# Patient Record
Sex: Female | Born: 1940
Health system: Southern US, Community
[De-identification: ages and names within clinical notes are randomized; demographics above are authoritative.]

## PROBLEM LIST (undated history)

## (undated) DIAGNOSIS — M816 Localized osteoporosis [Lequesne]: Secondary | ICD-10-CM

## (undated) DIAGNOSIS — T7840XA Allergy, unspecified, initial encounter: Secondary | ICD-10-CM

## (undated) DIAGNOSIS — H269 Unspecified cataract: Secondary | ICD-10-CM

## (undated) HISTORY — PX: BREAST CYST EXCISION: SHX579

## (undated) HISTORY — PX: UTERINE FIBROID SURGERY: SHX826

## (undated) HISTORY — DX: Unspecified cataract: H26.9

## (undated) HISTORY — DX: Allergy, unspecified, initial encounter: T78.40XA

## (undated) HISTORY — DX: Localized osteoporosis (Lequesne): M81.6

---

## 2007-08-22 HISTORY — PX: CATARACT EXTRACTION, BILATERAL: SHX1313

## 2007-08-22 HISTORY — PX: URETHRAL SLING: SHX2621

## 2007-09-23 ENCOUNTER — Ambulatory Visit (HOSPITAL_BASED_OUTPATIENT_CLINIC_OR_DEPARTMENT_OTHER): Admission: RE | Admit: 2007-09-23 | Discharge: 2007-09-23 | Payer: Self-pay | Admitting: Urology

## 2011-01-03 NOTE — Op Note (Signed)
[name], [name]                ACCOUNT NO.:  [id number]   MEDICAL RECORD NO.:  [id number]          PATIENT TYPE:  AMB   LOCATION:  [facility]                         FACILITY:  [facility]   PHYSICIAN:  Valetta Fuller, M.D.  DATE OF BIRTH:  11/11/1940   DATE OF PROCEDURE:  DATE OF DISCHARGE:                               OPERATIVE REPORT   PREOPERATIVE DIAGNOSIS:  Stress urinary incontinence.   POSTOPERATIVE DIAGNOSIS:  Stress urinary incontinence.   PROCEDURE PERFORMED:  Suburethral sling via retropubic approach [facility]  [employer] sling).   SURGEON:  Valetta Fuller, M.D.   ANESTHESIA:  General.   INDICATIONS:  [name] is a 70 year old female.  She had come in with  about a 5-year history of progressive urinary incontinence.  This  appeared to be mostly stress-based by history.  She also had some  leakage without awareness.  The patient was also noted to have  microhematuria.  Assessment included urodynamics which showed a slight  degree of minimal bladder instability.  She had a low Valsalva leak  point pressure of 60 cm water pressure at 275 mL capacity.  Leakage was  documented.  Her microhematuria workup was otherwise negative.  She  underwent extensive counseling about treatment options for her stress  incontinence.  She appeared to understand the advantages and  disadvantages of a suburethral sling.  We talked about success rates,  potential complications, including infection, erosion, overcorrection,  as well as failure to resolve the problem.  She presents now for the  procedure.   TECHNIQUE AND FINDINGS:  The patient was brought to the operating room  where she had successful induction of general anesthesia.  She was  placed in lithotomy position and prepped and draped in usual manner.  A  Foley catheter was inserted and the bladder completely drained.  A  Deaver was used for posterior retraction.  The anterior vaginal wall  overlying the mid- urethra was  infiltrated.  A small incision was then  made over the mid-urethra.  A combination of sharp and blunt dissection  technique was utilized to establish a plane between the mid-urethra and  vagina until we were able to palpate underneath the retropubic space.  Once that was accomplished, the bladder again was carefully drained.  Two small stab incisions were made just to the lateral aspect of the  midline above the pubic symphysis, and with direct digital finger  control, the needles were brought out on either side of the urethra.  Cystoscopy was then performed.  The needles appeared to be in position  at the bladder neck.  There was no evidence of any bladder perforation  and the needles were outside the bladder and urethra.  The [employer] sling was then brought out the suprapubic incisions.  A  right angle clamp was placed at the mid-urethra to assure proper  tensioning.  The access sheath was then taken off and the redundant  sling cut at the level of the skin.  Some copious irrigation was then  utilized.  The vaginal incision was then closed with 2-0 Vicryl  suture.  Estrace vaginal cream was placed along with some vaginal packing, and  the catheter was left to gravity drainage.  The patient had Dermabond  closure of her suprapubic incisions.   The patient appeared to tolerate the procedure well.  There were no  obvious complications or difficulties.  She was brought to the recovery  room in stable condition.           ______________________________  Valetta Fuller, M.D.  Electronically Signed     DSG/MEDQ  D:  09/23/2007  T:  09/23/2007  Job:  [id number]

## 2016-12-05 ENCOUNTER — Other Ambulatory Visit: Payer: Self-pay | Admitting: Physician Assistant

## 2016-12-29 ENCOUNTER — Encounter: Payer: Self-pay | Admitting: Physician Assistant

## 2016-12-29 ENCOUNTER — Ambulatory Visit (INDEPENDENT_AMBULATORY_CARE_PROVIDER_SITE_OTHER): Payer: Medicare Other | Admitting: Physician Assistant

## 2016-12-29 VITALS — BP 137/76 | HR 81 | Temp 97.1°F | Ht <= 58 in | Wt 147.0 lb

## 2016-12-29 DIAGNOSIS — J309 Allergic rhinitis, unspecified: Secondary | ICD-10-CM

## 2016-12-29 DIAGNOSIS — M816 Localized osteoporosis [Lequesne]: Secondary | ICD-10-CM | POA: Diagnosis not present

## 2016-12-29 DIAGNOSIS — Z0000 Encounter for general adult medical examination without abnormal findings: Secondary | ICD-10-CM

## 2016-12-29 DIAGNOSIS — Z961 Presence of intraocular lens: Secondary | ICD-10-CM | POA: Diagnosis not present

## 2016-12-29 DIAGNOSIS — E559 Vitamin D deficiency, unspecified: Secondary | ICD-10-CM | POA: Diagnosis not present

## 2016-12-29 DIAGNOSIS — Z9849 Cataract extraction status, unspecified eye: Secondary | ICD-10-CM

## 2016-12-29 HISTORY — DX: Localized osteoporosis (Lequesne): M81.6

## 2016-12-29 MED ORDER — RALOXIFENE HCL 60 MG PO TABS: 60.0000 mg | ORAL_TABLET | Freq: Every day | ORAL | 3 refills | Status: DC

## 2016-12-29 NOTE — Patient Instructions (Signed)
Health Maintenance, Female Adopting a healthy lifestyle and getting preventive care can go a long way to promote health and wellness. Talk with your health care provider about what schedule of regular examinations is right for you. This is a good chance for you to check in with your provider about disease prevention and staying healthy. In between checkups, there are plenty of things you can do on your own. Experts have done a lot of research about which lifestyle changes and preventive measures are most likely to keep you healthy. Ask your health care provider for more information. Weight and diet Eat a healthy diet  Be sure to include plenty of vegetables, fruits, low-fat dairy products, and lean protein.  Do not eat a lot of foods high in solid fats, added sugars, or salt.  Get regular exercise. This is one of the most important things you can do for your health.  Most adults should exercise for at least 150 minutes each week. The exercise should increase your heart rate and make you sweat (moderate-intensity exercise).  Most adults should also do strengthening exercises at least twice a week. This is in addition to the moderate-intensity exercise. Maintain a healthy weight  Body mass index (BMI) is a measurement that can be used to identify possible weight problems. It estimates body fat based on height and weight. Your health care provider can help determine your BMI and help you achieve or maintain a healthy weight.  For females 76 years of age and older:  A BMI below 18.5 is considered underweight.  A BMI of 18.5 to 24.9 is normal.  A BMI of 25 to 29.9 is considered overweight.  A BMI of 30 and above is considered obese. Watch levels of cholesterol and blood lipids  You should start having your blood tested for lipids and cholesterol at 78 years of age, then have this test every 5 years.  You may need to have your cholesterol levels checked more often if:  Your lipid or  cholesterol levels are high.  You are older than 78 years of age.  You are at high risk for heart disease. Cancer screening Lung Cancer  Lung cancer screening is recommended for adults 64-42 years old who are at high risk for lung cancer because of a history of smoking.  A yearly low-dose CT scan of the lungs is recommended for people who:  Currently smoke.  Have quit within the past 15 years.  Have at least a 30-pack-year history of smoking. A pack year is smoking an average of one pack of cigarettes a day for 1 year.  Yearly screening should continue until it has been 15 years since you quit.  Yearly screening should stop if you develop a health problem that would prevent you from having lung cancer treatment. Breast Cancer  Practice breast self-awareness. This means understanding how your breasts normally appear and feel.  It also means doing regular breast self-exams. Let your health care provider know about any changes, no matter how small.  If you are in your 20s or 30s, you should have a clinical breast exam (CBE) by a health care provider every 1-3 years as part of a regular health exam.  If you are 34 or older, have a CBE every year. Also consider having a breast X-ray (mammogram) every year.  If you have a family history of breast cancer, talk to your health care provider about genetic screening.  If you are at high risk for breast cancer, talk  to your health care provider about having an MRI and a mammogram every year.  Breast cancer gene (BRCA) assessment is recommended for women who have family members with BRCA-related cancers. BRCA-related cancers include:  Breast.  Ovarian.  Tubal.  Peritoneal cancers.  Results of the assessment will determine the need for genetic counseling and BRCA1 and BRCA2 testing. Cervical Cancer  Your health care provider may recommend that you be screened regularly for cancer of the pelvic organs (ovaries, uterus, and vagina).  This screening involves a pelvic examination, including checking for microscopic changes to the surface of your cervix (Pap test). You may be encouraged to have this screening done every 3 years, beginning at age 24.  For women ages 66-65, health care providers may recommend pelvic exams and Pap testing every 3 years, or they may recommend the Pap and pelvic exam, combined with testing for human papilloma virus (HPV), every 5 years. Some types of HPV increase your risk of cervical cancer. Testing for HPV may also be done on women of any age with unclear Pap test results.  Other health care providers may not recommend any screening for nonpregnant women who are considered low risk for pelvic cancer and who do not have symptoms. Ask your health care provider if a screening pelvic exam is right for you.  If you have had past treatment for cervical cancer or a condition that could lead to cancer, you need Pap tests and screening for cancer for at least 20 years after your treatment. If Pap tests have been discontinued, your risk factors (such as having a new sexual partner) need to be reassessed to determine if screening should resume. Some women have medical problems that increase the chance of getting cervical cancer. In these cases, your health care provider may recommend more frequent screening and Pap tests. Colorectal Cancer  This type of cancer can be detected and often prevented.  Routine colorectal cancer screening usually begins at 78 years of age and continues through 78 years of age.  Your health care provider may recommend screening at an earlier age if you have risk factors for colon cancer.  Your health care provider may also recommend using home test kits to check for hidden blood in the stool.  A small camera at the end of a tube can be used to examine your colon directly (sigmoidoscopy or colonoscopy). This is done to check for the earliest forms of colorectal cancer.  Routine  screening usually begins at age 41.  Direct examination of the colon should be repeated every 5-10 years through 78 years of age. However, you may need to be screened more often if early forms of precancerous polyps or small growths are found. Skin Cancer  Check your skin from head to toe regularly.  Tell your health care provider about any new moles or changes in moles, especially if there is a change in a mole's shape or color.  Also tell your health care provider if you have a mole that is larger than the size of a pencil eraser.  Always use sunscreen. Apply sunscreen liberally and repeatedly throughout the day.  Protect yourself by wearing long sleeves, pants, a wide-brimmed hat, and sunglasses whenever you are outside. Heart disease, diabetes, and high blood pressure  High blood pressure causes heart disease and increases the risk of stroke. High blood pressure is more likely to develop in:  People who have blood pressure in the high end of the normal range (130-139/85-89 mm Hg).  People who are overweight or obese.  People who are African American.  If you are 59-24 years of age, have your blood pressure checked every 3-5 years. If you are 34 years of age or older, have your blood pressure checked every year. You should have your blood pressure measured twice-once when you are at a hospital or clinic, and once when you are not at a hospital or clinic. Record the average of the two measurements. To check your blood pressure when you are not at a hospital or clinic, you can use:  An automated blood pressure machine at a pharmacy.  A home blood pressure monitor.  If you are between 29 years and 60 years old, ask your health care provider if you should take aspirin to prevent strokes.  Have regular diabetes screenings. This involves taking a blood sample to check your fasting blood sugar level.  If you are at a normal weight and have a low risk for diabetes, have this test once  every three years after 78 years of age.  If you are overweight and have a high risk for diabetes, consider being tested at a younger age or more often. Preventing infection Hepatitis B  If you have a higher risk for hepatitis B, you should be screened for this virus. You are considered at high risk for hepatitis B if:  You were born in a country where hepatitis B is common. Ask your health care provider which countries are considered high risk.  Your parents were born in a high-risk country, and you have not been immunized against hepatitis B (hepatitis B vaccine).  You have HIV or AIDS.  You use needles to inject street drugs.  You live with someone who has hepatitis B.  You have had sex with someone who has hepatitis B.  You get hemodialysis treatment.  You take certain medicines for conditions, including cancer, organ transplantation, and autoimmune conditions. Hepatitis C  Blood testing is recommended for:  Everyone born from [birth year] through 1967.  Anyone with known risk factors for hepatitis C. Sexually transmitted infections (STIs)  You should be screened for sexually transmitted infections (STIs) including gonorrhea and chlamydia if:  You are sexually active and are younger than 78 years of age.  You are older than 78 years of age and your health care provider tells you that you are at risk for this type of infection.  Your sexual activity has changed since you were last screened and you are at an increased risk for chlamydia or gonorrhea. Ask your health care provider if you are at risk.  If you do not have HIV, but are at risk, it may be recommended that you take a prescription medicine daily to prevent HIV infection. This is called pre-exposure prophylaxis (PrEP). You are considered at risk if:  You are sexually active and do not regularly use condoms or know the HIV status of your partner(s).  You take drugs by injection.  You are sexually active with a partner  who has HIV. Talk with your health care provider about whether you are at high risk of being infected with HIV. If you choose to begin PrEP, you should first be tested for HIV. You should then be tested every 3 months for as long as you are taking PrEP. Pregnancy  If you are premenopausal and you may become pregnant, ask your health care provider about preconception counseling.  If you may become pregnant, take 400 to 800 micrograms (mcg) of folic acid  every day.  If you want to prevent pregnancy, talk to your health care provider about birth control (contraception). Osteoporosis and menopause  Osteoporosis is a disease in which the bones lose minerals and strength with aging. This can result in serious bone fractures. Your risk for osteoporosis can be identified using a bone density scan.  If you are 4 years of age or older, or if you are at risk for osteoporosis and fractures, ask your health care provider if you should be screened.  Ask your health care provider whether you should take a calcium or vitamin D supplement to lower your risk for osteoporosis.  Menopause may have certain physical symptoms and risks.  Hormone replacement therapy may reduce some of these symptoms and risks. Talk to your health care provider about whether hormone replacement therapy is right for you. Follow these instructions at home:  Schedule regular health, dental, and eye exams.  Stay current with your immunizations.  Do not use any tobacco products including cigarettes, chewing tobacco, or electronic cigarettes.  If you are pregnant, do not drink alcohol.  If you are breastfeeding, limit how much and how often you drink alcohol.  Limit alcohol intake to no more than 1 drink per day for nonpregnant women. One drink equals 12 ounces of beer, 5 ounces of wine, or 1 ounces of hard liquor.  Do not use street drugs.  Do not share needles.  Ask your health care provider for help if you need support  or information about quitting drugs.  Tell your health care provider if you often feel depressed.  Tell your health care provider if you have ever been abused or do not feel safe at home. This information is not intended to replace advice given to you by your health care provider. Make sure you discuss any questions you have with your health care provider. Document Released: 02/20/2011 Document Revised: 01/13/2016 Document Reviewed: 05/11/2015 Elsevier Interactive Patient Education  2017 [employer].

## 2016-12-29 NOTE — Progress Notes (Signed)
BP (!) 146/79   Pulse 81   Temp 97.1 F (36.2 C) (Oral)   Ht _0  (1.473 m)   Wt 147 lb (66.7 kg)   BMI 30.72 kg/m    Subjective:    Patient ID: [name], female    DOB: 09-Sep-1940, 76 y.o.   MRN: [id number]  HPI: [name] is a 76 y.o. female presenting on 12/29/2016 for New Patient (Initial Visit) (pt here today for CPE, no concerns voiced)  This patient comes in for periodic recheck on medications and conditions including vitamin D deficiency, allergic rhinitis, osteoporosis.   All medications are reviewed today. There are no reports of any problems with the medications. All of the medical conditions are reviewed and updated.  Lab work is reviewed and will be ordered as medically necessary. There are no new problems reported with today's visit.   Relevant past medical, surgical, family and social history reviewed and updated as indicated. Allergies and medications reviewed and updated.  Past Medical History:  Diagnosis Date  . Allergy    seasonal  . Cataract   . Localized osteoporosis without current pathological fracture 12/29/2016    Past Surgical History:  Procedure Laterality Date  . CATARACT EXTRACTION, BILATERAL  2009  . CESAREAN SECTION    . URETHRAL SLING  2009  . UTERINE FIBROID SURGERY      Review of Systems  Constitutional: Negative.  Negative for activity change, fatigue and fever.  HENT: Negative.   Eyes: Negative.   Respiratory: Negative.  Negative for cough.   Cardiovascular: Negative.  Negative for chest pain.  Gastrointestinal: Negative.  Negative for abdominal pain.  Endocrine: Negative.   Genitourinary: Negative.  Negative for dysuria.  Musculoskeletal: Negative.   Skin: Negative.   Neurological: Negative.     Allergies as of 12/29/2016   No Known Allergies     Medication List       Accurate as of 12/29/16  9:03 AM. Always use your most recent med list.          raloxifene 60 MG tablet Commonly known as:  EVISTA Take  60 mg by mouth daily.          Objective:    BP (!) 146/79   Pulse 81   Temp 97.1 F (36.2 C) (Oral)   Ht _1  (1.473 m)   Wt 147 lb (66.7 kg)   BMI 30.72 kg/m   No Known Allergies  Physical Exam  Constitutional: She is oriented to person, place, and time. She appears well-developed and well-nourished.  HENT:  Head: Normocephalic and atraumatic.  Right Ear: Tympanic membrane, external ear and ear canal normal.  Left Ear: Tympanic membrane, external ear and ear canal normal.  Nose: Nose normal. No rhinorrhea.  Mouth/Throat: Oropharynx is clear and moist and mucous membranes are normal. No oropharyngeal exudate or posterior oropharyngeal erythema.  Eyes: Conjunctivae and EOM are normal. Pupils are equal, round, and reactive to light.  Neck: Normal range of motion. Neck supple.  Cardiovascular: Normal rate, regular rhythm, normal heart sounds and intact distal pulses.   Pulmonary/Chest: Effort normal and breath sounds normal.  Abdominal: Soft. Bowel sounds are normal.  Neurological: She is alert and oriented to person, place, and time. She has normal reflexes.  Skin: Skin is warm and dry. No rash noted.  Psychiatric: She has a normal mood and affect. Her behavior is normal. Judgment and thought content normal.  Nursing note and vitals reviewed.  Assessment & Plan:   1. Localized osteoporosis without current pathological fracture - raloxifene (EVISTA) 60 MG tablet; Take 60 mg by mouth daily. - VITAMIN D 25 Hydroxy (Vit-D Deficiency, Fractures)  2. Allergic rhinitis, unspecified seasonality, unspecified trigger  3. History of cataract removal with insertion of prosthetic lens  4. Vitamin D deficiency - VITAMIN D 25 Hydroxy (Vit-D Deficiency, Fractures)  5. Well adult exam - CMP14+EGFR - CBC with Differential/Platelet - Lipid panel   Current Outpatient Prescriptions:  .  raloxifene (EVISTA) 60 MG tablet, Take 60 mg by mouth daily., Disp: , Rfl:   Continue  all other maintenance medications as listed above.  Follow up plan: Recheck 1 year  Educational handout given for health maintenance  Terald Sleeper PA-C [facility] [address]  [place], [place] [address] [phone]   12/29/2016, 9:03 AM

## 2016-12-30 LAB — CMP14+EGFR
ALBUMIN: 4.3 g/dL (ref 3.5–4.8)
ALT: 16 IU/L (ref 0–32)
AST: 21 IU/L (ref 0–40)
Albumin/Globulin Ratio: 1.7 (ref 1.2–2.2)
Alkaline Phosphatase: 97 IU/L (ref 39–117)
BUN / CREAT RATIO: 12 (ref 12–28)
BUN: 9 mg/dL (ref 8–27)
Bilirubin Total: 1.5 mg/dL — ABNORMAL HIGH (ref 0.0–1.2)
CO2: 25 mmol/L (ref 18–29)
CREATININE: 0.73 mg/dL (ref 0.57–1.00)
Calcium: 9.2 mg/dL (ref 8.7–10.3)
Chloride: 101 mmol/L (ref 96–106)
GFR calc non Af Amer: 79 mL/min/{1.73_m2} (ref >59)
GFR, EST AFRICAN AMERICAN: 91 mL/min/{1.73_m2} (ref >59)
GLOBULIN, TOTAL: 2.5 g/dL (ref 1.5–4.5)
GLUCOSE: 89 mg/dL (ref 65–99)
Potassium: 4.1 mmol/L (ref 3.5–5.2)
SODIUM: 141 mmol/L (ref 134–144)
TOTAL PROTEIN: 6.8 g/dL (ref 6.0–8.5)

## 2016-12-30 LAB — CBC WITH DIFFERENTIAL/PLATELET
BASOS: 1 %
Basophils Absolute: 0.1 10*3/uL (ref 0.0–0.2)
EOS (ABSOLUTE): 0.2 10*3/uL (ref 0.0–0.4)
EOS: 4 %
HEMATOCRIT: 42.2 % (ref 34.0–46.6)
HEMOGLOBIN: 14.8 g/dL (ref 11.1–15.9)
Immature Grans (Abs): 0 10*3/uL (ref 0.0–0.1)
Immature Granulocytes: 0 %
LYMPHS ABS: 1.7 10*3/uL (ref 0.7–3.1)
Lymphs: 29 %
MCH: 30.6 pg (ref 26.6–33.0)
MCHC: 35.1 g/dL (ref 31.5–35.7)
MCV: 87 fL (ref 79–97)
MONOCYTES: 11 %
MONOS ABS: 0.7 10*3/uL (ref 0.1–0.9)
Neutrophils Absolute: 3.1 10*3/uL (ref 1.4–7.0)
Neutrophils: 55 %
PLATELETS: 347 10*3/uL (ref 150–379)
RBC: 4.83 x10E6/uL (ref 3.77–5.28)
RDW: 14 % (ref 12.3–15.4)
WBC: 5.8 10*3/uL (ref 3.4–10.8)

## 2016-12-30 LAB — LIPID PANEL
Chol/HDL Ratio: 3.3 ratio (ref 0.0–4.4)
Cholesterol, Total: 224 mg/dL — ABNORMAL HIGH (ref 100–199)
HDL: 68 mg/dL (ref >39)
LDL Calculated: 143 mg/dL — ABNORMAL HIGH (ref 0–99)
Triglycerides: 66 mg/dL (ref 0–149)
VLDL Cholesterol Cal: 13 mg/dL (ref 5–40)

## 2016-12-30 LAB — VITAMIN D 25 HYDROXY (VIT D DEFICIENCY, FRACTURES): Vit D, 25-Hydroxy: 23.7 ng/mL — ABNORMAL LOW (ref 30.0–100.0)

## 2017-07-18 ENCOUNTER — Ambulatory Visit (INDEPENDENT_AMBULATORY_CARE_PROVIDER_SITE_OTHER): Payer: Medicare Other | Admitting: Physician Assistant

## 2017-07-18 ENCOUNTER — Encounter: Payer: Self-pay | Admitting: Physician Assistant

## 2017-07-18 DIAGNOSIS — H029 Unspecified disorder of eyelid: Secondary | ICD-10-CM

## 2017-07-18 DIAGNOSIS — L719 Rosacea, unspecified: Secondary | ICD-10-CM | POA: Diagnosis not present

## 2017-07-18 MED ORDER — METRONIDAZOLE 1 % EX GEL: Freq: Every day | CUTANEOUS | 5 refills | Status: DC

## 2017-07-18 MED ORDER — CEPHALEXIN 500 MG PO CAPS: 500.0000 mg | ORAL_CAPSULE | Freq: Three times a day (TID) | ORAL | 0 refills | Status: DC

## 2017-07-18 NOTE — Progress Notes (Signed)
BP (!) 144/89   Pulse 85   Temp 97.9 F (36.6 C) (Oral)   Ht 4\' 10"  (1.473 m)   Wt 150 lb 6.4 oz (68.2 kg)   BMI 31.43 kg/m    Subjective:    Patient ID: [name], female    DOB: 1941/03/21, 76 y.o.   MRN: [id number]  HPI: [name] is a 76 y.o. female presenting on 07/18/2017 for left upper eyelid swollen and red  Patient reports that over the past 2 months she has had a lesion on her left eyelid.  He does not grown, it is not tender.  There is been no drainage from it.  It does not hurt on the inside of her IR on the surface of her eyeball.  She does not have an ophthalmologist at this time.    Reports a change in her skin primarily on the cheeks and nose.  That she will have significant redness.  She has not attributed it to any type of food or hot beverage that she has had.  She states that she will have lesions under the skin.  She does have some family members with acne rosacea.  Relevant past medical, surgical, family and social history reviewed and updated as indicated. Allergies and medications reviewed and updated.  Past Medical History:  Diagnosis Date  . Allergy    seasonal  . Cataract   . Localized osteoporosis without current pathological fracture 12/29/2016    Past Surgical History:  Procedure Laterality Date  . CATARACT EXTRACTION, BILATERAL  2009  . CESAREAN SECTION    . URETHRAL SLING  2009  . UTERINE FIBROID SURGERY      Review of Systems  Constitutional: Negative.   HENT: Negative.   Eyes: Negative.   Respiratory: Negative.   Gastrointestinal: Negative.   Genitourinary: Negative.   Skin: Positive for color change and rash.    Allergies as of 07/18/2017   No Known Allergies     Medication List        Accurate as of 07/18/17 12:52 PM. Always use your most recent med list.          cephALEXin 500 MG capsule Commonly known as:  KEFLEX Take 1 capsule (500 mg total) by mouth 3 (three) times daily.   metroNIDAZOLE 1 %  gel Commonly known as:  METROGEL Apply topically daily.   raloxifene 60 MG tablet Commonly known as:  EVISTA Take 1 tablet (60 mg total) by mouth daily.          Objective:    BP (!) 144/89   Pulse 85   Temp 97.9 F (36.6 C) (Oral)   Ht 4\' 10"  (1.473 m)   Wt 150 lb 6.4 oz (68.2 kg)   BMI 31.43 kg/m   No Known Allergies  Physical Exam  Constitutional: She is oriented to person, place, and time. She appears well-developed and well-nourished.  HENT:  Head: Normocephalic and atraumatic.  Eyes: Conjunctivae and EOM are normal. Pupils are equal, round, and reactive to light. Right eye exhibits no chemosis, no discharge and no hordeolum. Left eye exhibits hordeolum. Left eye exhibits no chemosis and no discharge. Left conjunctiva is not injected. Left conjunctiva has no hemorrhage.    Cardiovascular: Normal rate, regular rhythm, normal heart sounds and intact distal pulses.  Pulmonary/Chest: Effort normal and breath sounds normal.  Abdominal: Soft. Bowel sounds are normal.  Neurological: She is alert and oriented to person, place, and time. She has normal reflexes.  Skin: Skin is warm and dry. No rash noted. There is erythema.  Redness to malar surfaces and slight increase of broken vasculature, no lesions are palpated  Psychiatric: She has a normal mood and affect. Her behavior is normal. Judgment and thought content normal.        Assessment & Plan:   1. Eyelid lesion - cephALEXin (KEFLEX) 500 MG capsule; Take 1 capsule (500 mg total) by mouth 3 (three) times daily.  Dispense: 30 capsule; Refill: 0  2. Acne rosacea - metroNIDAZOLE (METROGEL) 1 % gel; Apply topically daily.  Dispense: 45 g; Refill: 5    Current Outpatient Medications:  .  raloxifene (EVISTA) 60 MG tablet, Take 1 tablet (60 mg total) by mouth daily., Disp: 90 tablet, Rfl: 3 .  cephALEXin (KEFLEX) 500 MG capsule, Take 1 capsule (500 mg total) by mouth 3 (three) times daily., Disp: 30 capsule, Rfl: 0 .   metroNIDAZOLE (METROGEL) 1 % gel, Apply topically daily., Disp: 45 g, Rfl: 5 Continue all other maintenance medications as listed above.  Follow up plan: Return if symptoms worsen or fail to improve.  Educational handout given for survey  Sheila LofflerAngel S. Daija Routson PA-C [facility] [address]  [place], [place] [id number] [phone]   07/18/2017, 12:52 PM

## 2017-07-18 NOTE — Patient Instructions (Signed)
Rosacea Rosacea is a long-term (chronic) condition that affects the skin of the face, including the cheeks, nose, brow, and chin. This condition can also affect the eyes. Rosacea causes blood vessels near the surface of the skin to get bigger (be enlarged), and that makes the skin red. There is no cure for this condition, but treatment can help to control your symptoms. Follow these instructions at home: Skin Care Take care of your skin as told by your doctor. Your doctor may tell you do these things:  Wash your skin gently two or more times each day.  Use mild soap.  Use a sunscreen or sunblock with SPF 30 or greater.  Use gentle cosmetics that are meant for sensitive skin.  Shave with an electric shaver instead of a blade.  Lifestyle  Try to keep track of what foods trigger this condition. Avoid any triggers. These may include: ? Spicy foods. ? Seafood. ? Cheese. ? Hot liquids. ? Nuts. ? Chocolate. ? Iodized salt.  Do not drink alcohol.  Avoid extremely cold or hot temperatures.  Try to reduce your stress. If you need help to do this, talk with your doctor.  When you exercise, do these things to stay cool: ? Limit your sun exposure. ? Use a fan. ? Exercise for a shorter time, and exercise more often. General instructions  Keep all follow-up visits as told by your doctor. This is important.  Take over-the-counter and prescription medicines only as told by your doctor.  If your eyelids are affected, press warm compresses to them. Do this as told by your doctor.  If you were prescribed an antibiotic medicine, apply or take it as told by your doctor. Do not stop using the antibiotic even if your condition improves. Contact a doctor if:  Your symptoms get worse.  Your symptoms do not improve after two months of treatment.  You have new symptoms.  You have any changes in vision or you have problems with your eyes, such as redness or itching.  You feel very sad  (depressed).  You lose your appetite.  You have trouble concentrating. This information is not intended to replace advice given to you by your health care provider. Make sure you discuss any questions you have with your health care provider. Document Released: 10/30/2011 Document Revised: 01/13/2016 Document Reviewed: 10/14/2014 Elsevier Interactive Patient Education  2018 Elsevier Inc.  

## 2017-09-07 ENCOUNTER — Ambulatory Visit: Payer: Medicare Other | Admitting: Physician Assistant

## 2017-12-31 ENCOUNTER — Other Ambulatory Visit: Payer: Self-pay | Admitting: Physician Assistant

## 2017-12-31 DIAGNOSIS — M816 Localized osteoporosis [Lequesne]: Secondary | ICD-10-CM

## 2018-06-28 ENCOUNTER — Other Ambulatory Visit: Payer: Self-pay | Admitting: Physician Assistant

## 2018-06-28 DIAGNOSIS — M816 Localized osteoporosis [Lequesne]: Secondary | ICD-10-CM

## 2018-06-28 NOTE — Telephone Encounter (Signed)
Last seen 07/18/17  Lawanna Kobus  Needs to be seen

## 2018-06-28 NOTE — Telephone Encounter (Signed)
Lm to call and schedule apt. 

## 2018-07-13 ENCOUNTER — Other Ambulatory Visit: Payer: Self-pay | Admitting: Physician Assistant

## 2018-07-13 DIAGNOSIS — M816 Localized osteoporosis [Lequesne]: Secondary | ICD-10-CM

## 2018-08-26 LAB — HM MAMMOGRAPHY

## 2019-01-10 ENCOUNTER — Other Ambulatory Visit: Payer: Self-pay | Admitting: Physician Assistant

## 2019-01-10 DIAGNOSIS — M816 Localized osteoporosis [Lequesne]: Secondary | ICD-10-CM

## 2019-03-03 ENCOUNTER — Other Ambulatory Visit: Payer: Self-pay

## 2019-03-04 ENCOUNTER — Ambulatory Visit (INDEPENDENT_AMBULATORY_CARE_PROVIDER_SITE_OTHER): Payer: Medicare Other | Admitting: Physician Assistant

## 2019-03-04 ENCOUNTER — Encounter: Payer: Self-pay | Admitting: Physician Assistant

## 2019-03-04 VITALS — BP 142/83 | HR 81 | Temp 97.3°F | Ht <= 58 in | Wt 148.2 lb

## 2019-03-04 DIAGNOSIS — Z0000 Encounter for general adult medical examination without abnormal findings: Secondary | ICD-10-CM

## 2019-03-04 DIAGNOSIS — M816 Localized osteoporosis [Lequesne]: Secondary | ICD-10-CM

## 2019-03-04 MED ORDER — RALOXIFENE HCL 60 MG PO TABS: 60.0000 mg | ORAL_TABLET | Freq: Every day | ORAL | 3 refills | Status: DC

## 2019-03-04 NOTE — Progress Notes (Signed)
BP (!) 142/83   Pulse 81   Temp (!) 97.3 F (36.3 C) (Oral)   Ht '4\' 10"'$  (1.473 m)   Wt 148 lb 3.2 oz (67.2 kg)   BMI 30.97 kg/m    Subjective:    Patient ID: [name], female    DOB: 25-Mar-1941, 78 y.o.   MRN: [id number]  HPI: [name] is a 78 y.o. female presenting on 03/04/2019 for Medical Management of Chronic Issues  Patient comes in for chronic recheck on her conditions.  She does osteoporosis.  And has been taking Evista for some time.  We will plan for her to have a DEXA performed later this summer.  Currently doing tech office to perform it.  She denies have any issues with her medications.  In the past she has had slight elevation of her cholesterol.  We are going to draw labs today.  She is having pain on her right anterior rib cage.  She was up on a stool leaning into her large washing machine about a week ago and slipped and fell in rubbing her right anterior ribs on the edge of the washing machine.  She is able to breathe and not have any difficulty she also he is waking up with pain sometimes during the night because she ends up laying on that.  Otherwise.  I have explained it can take 6 to 8 weeks for something like this to heal up, eat especially if there is a little fracture there.  She does climb going to have an x-ray today.  Past Medical History:  Diagnosis Date  . Allergy    seasonal  . Cataract   . Localized osteoporosis without current pathological fracture 12/29/2016   Relevant past medical, surgical, family and social history reviewed and updated as indicated. Interim medical history since our last visit reviewed. Allergies and medications reviewed and updated. DATA REVIEWED: CHART IN EPIC  Family History reviewed for pertinent findings.  Review of Systems  Constitutional: Negative.  Negative for activity change, fatigue and fever.  HENT: Negative.   Eyes: Negative.   Respiratory: Negative.  Negative for cough, shortness of breath and  wheezing.   Cardiovascular: Negative.  Negative for chest pain.  Gastrointestinal: Negative.  Negative for abdominal pain.  Endocrine: Negative.   Genitourinary: Negative.  Negative for dysuria.  Musculoskeletal: Negative.   Skin: Negative.   Neurological: Negative.     Allergies as of 03/04/2019   No Known Allergies     Medication List       Accurate as of March 04, 2019  9:02 AM. If you have any questions, ask your nurse or doctor.        STOP taking these medications   cephALEXin 500 MG capsule Commonly known as: KEFLEX Stopped by: Terald Sleeper, PA-C   metroNIDAZOLE 1 % gel Commonly known as: METROGEL Stopped by: Terald Sleeper, PA-C     TAKE these medications   raloxifene 60 MG tablet Commonly known as: EVISTA Take 1 tablet (60 mg total) by mouth daily.          Objective:    BP (!) 142/83   Pulse 81   Temp (!) 97.3 F (36.3 C) (Oral)   Ht '4\' 10"'$  (1.473 m)   Wt 148 lb 3.2 oz (67.2 kg)   BMI 30.97 kg/m   No Known Allergies  Wt Readings from Last 3 Encounters:  03/04/19 148 lb 3.2 oz (67.2 kg)  07/18/17 150 lb 6.4  oz (68.2 kg)  12/29/16 147 lb (66.7 kg)    Physical Exam Constitutional:      Appearance: She is well-developed.  HENT:     Head: Normocephalic and atraumatic.  Eyes:     Conjunctiva/sclera: Conjunctivae normal.     Pupils: Pupils are equal, round, and reactive to light.  Cardiovascular:     Rate and Rhythm: Normal rate and regular rhythm.     Heart sounds: Normal heart sounds.  Pulmonary:     Effort: Pulmonary effort is normal.     Breath sounds: Normal breath sounds.  Abdominal:     General: Bowel sounds are normal.     Palpations: Abdomen is soft.  Skin:    General: Skin is warm and dry.     Findings: No rash.  Neurological:     Mental Status: She is alert and oriented to person, place, and time.     Deep Tendon Reflexes: Reflexes are normal and symmetric.  Psychiatric:        Behavior: Behavior normal.        Thought  Content: Thought content normal.        Judgment: Judgment normal.     Results for orders placed or performed in visit on 09/05/18  HM MAMMOGRAPHY  Result Value Ref Range   HM Mammogram 0-4 Bi-Rad 0-4 Bi-Rad, Self Reported Normal      Assessment & Plan:   1. Localized osteoporosis without current pathological fracture - raloxifene (EVISTA) 60 MG tablet; Take 1 tablet (60 mg total) by mouth daily.  Dispense: 90 tablet; Refill: 3  2. Well adult exam - CBC with Differential/Platelet - CMP14+EGFR - Lipid panel - TSH   Continue all other maintenance medications as listed above.  Follow up plan: No follow-ups on file.  Educational handout given for [place] PA-C [facility] [address]  [place], [place] [address] [phone]   03/04/2019, 9:02 AM

## 2019-03-05 LAB — LIPID PANEL
Chol/HDL Ratio: 3.1 ratio (ref 0.0–4.4)
Cholesterol, Total: 211 mg/dL — ABNORMAL HIGH (ref 100–199)
HDL: 68 mg/dL (ref >39)
LDL Calculated: 122 mg/dL — ABNORMAL HIGH (ref 0–99)
Triglycerides: 103 mg/dL (ref 0–149)
VLDL Cholesterol Cal: 21 mg/dL (ref 5–40)

## 2019-03-05 LAB — CBC WITH DIFFERENTIAL/PLATELET
Basophils Absolute: 0 10*3/uL (ref 0.0–0.2)
Basos: 1 %
EOS (ABSOLUTE): 0.4 10*3/uL (ref 0.0–0.4)
Eos: 8 %
Hematocrit: 39.3 % (ref 34.0–46.6)
Hemoglobin: 13.8 g/dL (ref 11.1–15.9)
Immature Grans (Abs): 0 10*3/uL (ref 0.0–0.1)
Immature Granulocytes: 0 %
Lymphocytes Absolute: 1.7 10*3/uL (ref 0.7–3.1)
Lymphs: 32 %
MCH: 31 pg (ref 26.6–33.0)
MCHC: 35.1 g/dL (ref 31.5–35.7)
MCV: 88 fL (ref 79–97)
Monocytes Absolute: 0.5 10*3/uL (ref 0.1–0.9)
Monocytes: 8 %
Neutrophils Absolute: 2.7 10*3/uL (ref 1.4–7.0)
Neutrophils: 51 %
Platelets: 307 10*3/uL (ref 150–450)
RBC: 4.45 x10E6/uL (ref 3.77–5.28)
RDW: 13.3 % (ref 11.7–15.4)
WBC: 5.4 10*3/uL (ref 3.4–10.8)

## 2019-03-05 LAB — CMP14+EGFR
ALT: 13 IU/L (ref 0–32)
AST: 23 IU/L (ref 0–40)
Albumin/Globulin Ratio: 1.8 (ref 1.2–2.2)
Albumin: 4.3 g/dL (ref 3.7–4.7)
Alkaline Phosphatase: 82 IU/L (ref 39–117)
BUN/Creatinine Ratio: 16 (ref 12–28)
BUN: 12 mg/dL (ref 8–27)
Bilirubin Total: 1.3 mg/dL — ABNORMAL HIGH (ref 0.0–1.2)
CO2: 20 mmol/L (ref 20–29)
Calcium: 9 mg/dL (ref 8.7–10.3)
Chloride: 105 mmol/L (ref 96–106)
Creatinine, Ser: 0.73 mg/dL (ref 0.57–1.00)
GFR calc Af Amer: 90 mL/min/{1.73_m2} (ref >59)
GFR calc non Af Amer: 78 mL/min/{1.73_m2} (ref >59)
Globulin, Total: 2.4 g/dL (ref 1.5–4.5)
Glucose: 86 mg/dL (ref 65–99)
Potassium: 3.9 mmol/L (ref 3.5–5.2)
Sodium: 143 mmol/L (ref 134–144)
Total Protein: 6.7 g/dL (ref 6.0–8.5)

## 2019-03-05 LAB — TSH: TSH: 2.63 u[IU]/mL (ref 0.450–4.500)

## 2019-03-07 ENCOUNTER — Encounter: Payer: Self-pay | Admitting: *Deleted

## 2020-03-02 ENCOUNTER — Other Ambulatory Visit: Payer: Self-pay | Admitting: *Deleted

## 2020-03-02 DIAGNOSIS — M816 Localized osteoporosis [Lequesne]: Secondary | ICD-10-CM

## 2020-03-02 NOTE — Telephone Encounter (Signed)
Attempted to contact patient - NA °

## 2020-03-02 NOTE — Telephone Encounter (Signed)
Former Yetta Barre. NTBS LOV 03/04/19

## 2020-03-04 ENCOUNTER — Other Ambulatory Visit: Payer: Self-pay | Admitting: *Deleted

## 2020-03-04 DIAGNOSIS — M816 Localized osteoporosis [Lequesne]: Secondary | ICD-10-CM

## 2020-03-04 MED ORDER — RALOXIFENE HCL 60 MG PO TABS: 60.0000 mg | ORAL_TABLET | Freq: Every day | ORAL | 0 refills | Status: DC

## 2020-03-04 NOTE — Telephone Encounter (Signed)
Next OV 03/16/20 30 days sent to pharmacy

## 2020-03-16 ENCOUNTER — Encounter: Payer: Self-pay | Admitting: Family Medicine

## 2020-03-16 ENCOUNTER — Ambulatory Visit (INDEPENDENT_AMBULATORY_CARE_PROVIDER_SITE_OTHER): Payer: Medicare Other | Admitting: Family Medicine

## 2020-03-16 ENCOUNTER — Other Ambulatory Visit: Payer: Self-pay

## 2020-03-16 VITALS — BP 125/87 | HR 72 | Temp 97.1°F | Ht <= 58 in | Wt 145.4 lb

## 2020-03-16 DIAGNOSIS — M816 Localized osteoporosis [Lequesne]: Secondary | ICD-10-CM | POA: Diagnosis not present

## 2020-03-16 DIAGNOSIS — E559 Vitamin D deficiency, unspecified: Secondary | ICD-10-CM

## 2020-03-16 DIAGNOSIS — R011 Cardiac murmur, unspecified: Secondary | ICD-10-CM | POA: Diagnosis not present

## 2020-03-16 DIAGNOSIS — L719 Rosacea, unspecified: Secondary | ICD-10-CM

## 2020-03-16 NOTE — Progress Notes (Signed)
Subjective:  Patient ID: [name], female    DOB: 01/06/1941  Age: 79 y.o. MRN: [id number]  CC: Establish Care (Previous AJ pt) and Medication Refill   HPI [name] for follow-up on her osteoporosis.  She is taking raloxifene with no side effects for her osteoporosis.  She has been the caregiver for her husband who has multiple medical conditions.  Her husband and spent all of his life selling used car parts and used cars.  He still likes to [place] that and she is his caregiver.   Depression screen [facility] 2/9 03/16/2020 03/04/2019 07/18/2017  Decreased Interest 0 0 0  Down, Depressed, Hopeless 0 0 0  PHQ - 2 Score 0 0 0    History [name] has a past medical history of Allergy, Cataract, and Localized osteoporosis without current pathological fracture (12/29/2016).   She has a past surgical history that includes Cataract extraction, bilateral (2009); Urethral sling (2009); Cesarean section; and Uterine fibroid surgery.   Her family history includes COPD in her father; Heart disease in her mother.She reports that she has never smoked. She has never used smokeless tobacco. She reports that she does not drink alcohol and does not use drugs.    ROS Review of Systems  Constitutional: Negative.   HENT: Negative for congestion.   Eyes: Negative for visual disturbance.  Respiratory: Negative for shortness of breath.   Cardiovascular: Negative for chest pain.  Gastrointestinal: Negative for abdominal pain, constipation, diarrhea, nausea and vomiting.  Genitourinary: Negative for difficulty urinating.  Musculoskeletal: Negative for arthralgias and myalgias.  Neurological: Negative for headaches.  Psychiatric/Behavioral: Negative for sleep disturbance.    Objective:  BP (!) 125/87   Pulse 72   Temp (!) 97.1 F (36.2 C) (Temporal)   Ht 4' 10" (1.473 m)   Wt 145 lb 6.4 oz (66 kg)   BMI 30.39 kg/m   BP Readings from Last 3 Encounters:  03/16/20 (!) 125/87  03/04/19 (!)  142/83  07/18/17 (!) 144/89    Wt Readings from Last 3 Encounters:  03/16/20 145 lb 6.4 oz (66 kg)  03/04/19 148 lb 3.2 oz (67.2 kg)  07/18/17 150 lb 6.4 oz (68.2 kg)     Physical Exam Constitutional:      General: She is not in acute distress.    Appearance: She is well-developed.  HENT:     Head: Normocephalic and atraumatic.  Eyes:     Conjunctiva/sclera: Conjunctivae normal.     Pupils: Pupils are equal, round, and reactive to light.  Neck:     Thyroid: No thyromegaly.  Cardiovascular:     Rate and Rhythm: Normal rate and regular rhythm.     Heart sounds: Normal heart sounds. No murmur heard.   Pulmonary:     Effort: Pulmonary effort is normal. No respiratory distress.     Breath sounds: Normal breath sounds. No wheezing or rales.  Abdominal:     General: Bowel sounds are normal. There is no distension.     Palpations: Abdomen is soft.     Tenderness: There is no abdominal tenderness.  Musculoskeletal:        General: Normal range of motion.     Cervical back: Normal range of motion and neck supple.  Lymphadenopathy:     Cervical: No cervical adenopathy.  Skin:    General: Skin is warm and dry.  Neurological:     Mental Status: She is alert and oriented to person, place, and time.  Psychiatric:  Behavior: Behavior normal.        Thought Content: Thought content normal.        Judgment: Judgment normal.       Assessment & Plan:   [name] was seen today for establish care and medication refill.  Diagnoses and all orders for this visit:  Localized osteoporosis without current pathological fracture -     CBC with Differential/Platelet -     CMP14+EGFR -     Lipid panel -     VITAMIN D 25 Hydroxy (Vit-D Deficiency, Fractures)  Vitamin D deficiency -     CBC with Differential/Platelet -     CMP14+EGFR -     Lipid panel -     VITAMIN D 25 Hydroxy (Vit-D Deficiency, Fractures)  Acne rosacea -     CBC with Differential/Platelet -     CMP14+EGFR -      Lipid panel  Newly recognized heart murmur -     Ambulatory referral to Cardiology -     CBC with Differential/Platelet -     CMP14+EGFR -     Lipid panel       I am having [name] maintain her raloxifene.  Allergies as of 03/16/2020   No Known Allergies     Medication List       Accurate as of March 16, 2020  8:44 PM. If you have any questions, ask your nurse or doctor.        raloxifene 60 MG tablet Commonly known as: EVISTA Take 1 tablet (60 mg total) by mouth daily.        Follow-up: No follow-ups on file.  Claretta Fraise, M.D.

## 2020-03-17 LAB — LIPID PANEL
Chol/HDL Ratio: 3.6 ratio (ref 0.0–4.4)
Cholesterol, Total: 232 mg/dL — ABNORMAL HIGH (ref 100–199)
HDL: 65 mg/dL (ref >39)
LDL Chol Calc (NIH): 150 mg/dL — ABNORMAL HIGH (ref 0–99)
Triglycerides: 95 mg/dL (ref 0–149)
VLDL Cholesterol Cal: 17 mg/dL (ref 5–40)

## 2020-03-17 LAB — CBC WITH DIFFERENTIAL/PLATELET
Basophils Absolute: 0.1 10*3/uL (ref 0.0–0.2)
Basos: 1 %
EOS (ABSOLUTE): 0.3 10*3/uL (ref 0.0–0.4)
Eos: 4 %
Hematocrit: 43 % (ref 34.0–46.6)
Hemoglobin: 14.4 g/dL (ref 11.1–15.9)
Immature Grans (Abs): 0 10*3/uL (ref 0.0–0.1)
Immature Granulocytes: 0 %
Lymphocytes Absolute: 2.3 10*3/uL (ref 0.7–3.1)
Lymphs: 37 %
MCH: 30.3 pg (ref 26.6–33.0)
MCHC: 33.5 g/dL (ref 31.5–35.7)
MCV: 91 fL (ref 79–97)
Monocytes Absolute: 0.7 10*3/uL (ref 0.1–0.9)
Monocytes: 11 %
Neutrophils Absolute: 3 10*3/uL (ref 1.4–7.0)
Neutrophils: 47 %
Platelets: 303 10*3/uL (ref 150–450)
RBC: 4.75 x10E6/uL (ref 3.77–5.28)
RDW: 13.4 % (ref 11.7–15.4)
WBC: 6.3 10*3/uL (ref 3.4–10.8)

## 2020-03-17 LAB — CMP14+EGFR
ALT: 12 IU/L (ref 0–32)
AST: 20 IU/L (ref 0–40)
Albumin/Globulin Ratio: 1.6 (ref 1.2–2.2)
Albumin: 4.2 g/dL (ref 3.6–4.6)
Alkaline Phosphatase: 88 IU/L (ref 48–121)
BUN/Creatinine Ratio: 14 (ref 12–28)
BUN: 11 mg/dL (ref 8–27)
Bilirubin Total: 1.2 mg/dL (ref 0.0–1.2)
CO2: 23 mmol/L (ref 20–29)
Calcium: 9.5 mg/dL (ref 8.7–10.3)
Chloride: 107 mmol/L — ABNORMAL HIGH (ref 96–106)
Creatinine, Ser: 0.77 mg/dL (ref 0.57–1.00)
GFR calc Af Amer: 84 mL/min/{1.73_m2} (ref >59)
GFR calc non Af Amer: 73 mL/min/{1.73_m2} (ref >59)
Globulin, Total: 2.7 g/dL (ref 1.5–4.5)
Glucose: 89 mg/dL (ref 65–99)
Potassium: 4 mmol/L (ref 3.5–5.2)
Sodium: 143 mmol/L (ref 134–144)
Total Protein: 6.9 g/dL (ref 6.0–8.5)

## 2020-03-17 LAB — VITAMIN D 25 HYDROXY (VIT D DEFICIENCY, FRACTURES): Vit D, 25-Hydroxy: 31.4 ng/mL (ref 30.0–100.0)

## 2020-03-28 ENCOUNTER — Other Ambulatory Visit: Payer: Self-pay | Admitting: Family Medicine

## 2020-03-28 DIAGNOSIS — M816 Localized osteoporosis [Lequesne]: Secondary | ICD-10-CM

## 2020-05-18 ENCOUNTER — Encounter: Payer: Self-pay | Admitting: Cardiology

## 2020-05-18 DIAGNOSIS — R011 Cardiac murmur, unspecified: Secondary | ICD-10-CM | POA: Insufficient documentation

## 2020-05-18 DIAGNOSIS — Z7189 Other specified counseling: Secondary | ICD-10-CM | POA: Insufficient documentation

## 2020-05-18 HISTORY — DX: Cardiac murmur, unspecified: R01.1

## 2020-05-18 NOTE — Progress Notes (Signed)
Cardiology Office Note   Date:  05/19/2020   ID:  [name], [name] Jan 27, 1941, MRN [id number]  PCP:  Mechele Claude, MD  Cardiologist:   No primary care provider on file. Referring:  Dr. Darlyn Read  Chief Complaint  Patient presents with  . Heart Murmur      History of Present Illness: [name] is a 79 y.o. female who is referred by Mechele Claude, MD for evaluation of a murmur.  The patient has no past cardiac history and has never been told that she had a murmur before.  She actually has been quite healthy.  She lives with her husband.  She does all her activities of daily living and walks the dog 3 times a day. The patient denies any new symptoms such as chest discomfort, neck or arm discomfort. There has been no new shortness of breath, PND or orthopnea. There have been no reported palpitations, presyncope or syncope.   Interestingly her mother died suddenly at age 69.  Sounds like she developed some throat discomfort and was in the house semiconscious when the ambulance arrived and died soon thereafter in the hospital.  There were no details.  They mention a blood clot.  They thought a heart attack.  There is otherwise not been any other strong family history of sudden death.  Past Medical History:  Diagnosis Date  . Allergy    seasonal  . Cataract   . Localized osteoporosis without current pathological fracture 12/29/2016  . Murmur 05/18/2020    Past Surgical History:  Procedure Laterality Date  . CATARACT EXTRACTION, BILATERAL  2009  . CESAREAN SECTION    . URETHRAL SLING  2009  . UTERINE FIBROID SURGERY       Current Outpatient Medications  Medication Sig Dispense Refill  . raloxifene (EVISTA) 60 MG tablet TAKE 1 TABLET BY MOUTH EVERY DAY 30 tablet 10   No current facility-administered medications for this visit.    Allergies:   Patient has no known allergies.    Social History:  The patient  reports that she has never smoked. She has never used  smokeless tobacco. She reports that she does not drink alcohol and does not use drugs.   Family History:  The patient's family history includes COPD in her father; Heart disease in her mother.    ROS:  Please see the history of present illness.   Otherwise, review of systems are positive for none.   All other systems are reviewed and negative.    PHYSICAL EXAM: VS:  BP (!) 138/98   Pulse 74   Ht 4\' 11"  (1.499 m)   Wt 143 lb (64.9 kg)   BMI 28.88 kg/m  , BMI Body mass index is 28.88 kg/m. GENERAL:  Well appearing HEENT:  Pupils equal round and reactive, fundi not visualized, oral mucosa unremarkable NECK:  No jugular venous distention, waveform within normal limits, carotid upstroke brisk and symmetric, no bruits, no thyromegaly LYMPHATICS:  No cervical, inguinal adenopathy LUNGS:  Clear to auscultation bilaterally BACK:  No CVA tenderness CHEST:  Unremarkable HEART:  PMI not displaced or sustained,S1 and S2 within normal limits, no S3, no S4, no clicks, no rubs, 3 out of 6 apical systolic murmur mid peaking and radiating at the aortic outflow tract and increasing slightly with the strain phase of Valsalva, no diastolic murmurs ABD:  Flat, positive bowel sounds normal in frequency in pitch, no bruits, no rebound, no guarding, no midline pulsatile mass, no hepatomegaly,  no splenomegaly EXT:  2 plus pulses throughout, no edema, no cyanosis no clubbing SKIN:  No rashes no nodules NEURO:  Cranial nerves II through XII grossly intact, motor grossly intact throughout PSYCH:  Cognitively intact, oriented to person place and time    EKG:  EKG is ordered today. The ekg ordered today demonstrates sinus rhythm, rate 74, axis within normal limits, intervals within normal limits, no acute ST-T wave changes.   Recent Labs: 03/16/2020: ALT 12; BUN 11; Creatinine, Ser 0.77; Hemoglobin 14.4; Platelets 303; Potassium 4.0; Sodium 143    Lipid Panel    Component Value Date/Time   CHOL 232 (H)  03/16/2020 1422   TRIG 95 03/16/2020 1422   HDL 65 03/16/2020 1422   CHOLHDL 3.6 03/16/2020 1422   LDLCALC 150 (H) 03/16/2020 1422      Wt Readings from Last 3 Encounters:  05/19/20 143 lb (64.9 kg)  03/16/20 145 lb 6.4 oz (66 kg)  03/04/19 148 lb 3.2 oz (67.2 kg)      Other studies Reviewed: Additional studies/ records that were reviewed today include: Labs. Review of the above records demonstrates:  Please see elsewhere in the note.     ASSESSMENT AND PLAN:  MURMUR:   I suspect a dynamic outflow obstruction with hypertrophic septum but EKG would not support this.  She is certainly asymptomatic.  Continue to differentiate between this and aortic stenosis in my opinion less likely mitral regurgitation doing an echocardiogram.  She and I discussed possible etiologies and anatomy.  Further evaluation of her or her family will be based on these results.  HTN: Her blood pressure is elevated.  This is unusual.  She should keep a blood pressure diary.  No change in therapy.  COVID EDUCATION: She did have a Covid vaccine.  Current medicines are reviewed at length with the patient today.  The patient does not have concerns regarding medicines.  The following changes have been made:  no change  Labs/ tests ordered today include:   Orders Placed This Encounter  Procedures  . EKG 12-Lead  . ECHOCARDIOGRAM COMPLETE     Disposition:   FU with me in one year or sooner based on the results of the echo.   Signed, Rollene Rotunda, MD  05/19/2020 11:06 AM    [facility]

## 2020-05-19 ENCOUNTER — Encounter: Payer: Self-pay | Admitting: Cardiology

## 2020-05-19 ENCOUNTER — Ambulatory Visit (INDEPENDENT_AMBULATORY_CARE_PROVIDER_SITE_OTHER): Payer: Medicare Other | Admitting: Cardiology

## 2020-05-19 ENCOUNTER — Other Ambulatory Visit: Payer: Self-pay

## 2020-05-19 VITALS — BP 138/98 | HR 74 | Ht 59.0 in | Wt 143.0 lb

## 2020-05-19 DIAGNOSIS — Z7189 Other specified counseling: Secondary | ICD-10-CM | POA: Diagnosis not present

## 2020-05-19 DIAGNOSIS — R011 Cardiac murmur, unspecified: Secondary | ICD-10-CM

## 2020-05-19 NOTE — Patient Instructions (Addendum)
Medication Instructions:  The current medical regimen is effective;  continue present plan and medications.  *If you need a refill on your cardiac medications before your next appointment, please call your pharmacy*  Testing/Procedures: Your physician has requested that you have an echocardiogram. Echocardiography is a painless test that uses sound waves to create images of your heart. It provides your doctor with information about the size and shape of your heart and how well your heart's chambers and valves are working. This procedure takes approximately one hour. There are no restrictions for this procedure. This will be completed at [facility].  Please check in at 12:30 at the Main Entrance.  Follow-Up: At [facility], you and your health needs are our priority.  As part of our continuing mission to provide you with exceptional heart care, we have created designated Provider Care Teams.  These Care Teams include your primary Cardiologist (physician) and Advanced Practice Providers (APPs -  Physician Assistants and Nurse Practitioners) who all work together to provide you with the care you need, when you need it.  We recommend signing up for the patient portal called "MyChart".  Sign up information is provided on this After Visit Summary.  MyChart is used to connect with patients for Virtual Visits (Telemedicine).  Patients are able to view lab/test results, encounter notes, upcoming appointments, etc.  Non-urgent messages can be sent to your provider as well.   To learn more about what you can do with MyChart, go to [email or web address].    Your next appointment:   12 month(s)  The format for your next appointment:   In Person  Provider:   Rollene Rotunda, MD   Thank you for choosing [facility]!!

## 2020-05-25 ENCOUNTER — Ambulatory Visit (HOSPITAL_COMMUNITY)
Admission: RE | Admit: 2020-05-25 | Discharge: 2020-05-25 | Disposition: A | Discharge status: Home / Self Care | Payer: Medicare Other | Source: Ambulatory Visit | Attending: Cardiology | Admitting: Cardiology

## 2020-05-25 ENCOUNTER — Other Ambulatory Visit: Payer: Self-pay

## 2020-05-25 DIAGNOSIS — I350 Nonrheumatic aortic (valve) stenosis: Secondary | ICD-10-CM

## 2020-05-25 DIAGNOSIS — I361 Nonrheumatic tricuspid (valve) insufficiency: Secondary | ICD-10-CM | POA: Diagnosis not present

## 2020-05-25 DIAGNOSIS — R011 Cardiac murmur, unspecified: Secondary | ICD-10-CM | POA: Diagnosis not present

## 2020-05-25 LAB — ECHOCARDIOGRAM COMPLETE
AR max vel: 1.4 cm2
AV Area VTI: 1.46 cm2
AV Area mean vel: 1.27 cm2
AV Mean grad: 14.2 mmHg
AV Peak grad: 26.6 mmHg
Ao pk vel: 2.58 m/s
Area-P 1/2: 2.85 cm2
S' Lateral: 3.1 cm

## 2020-05-25 NOTE — Progress Notes (Signed)
*  PRELIMINARY RESULTS* Echocardiogram 2D Echocardiogram has been performed.  [name] 05/25/2020, 1:57 PM

## 2021-01-22 ENCOUNTER — Other Ambulatory Visit: Payer: Self-pay | Admitting: Family Medicine

## 2021-01-22 DIAGNOSIS — M816 Localized osteoporosis [Lequesne]: Secondary | ICD-10-CM

## 2021-03-09 ENCOUNTER — Ambulatory Visit (INDEPENDENT_AMBULATORY_CARE_PROVIDER_SITE_OTHER): Payer: Medicare Other | Admitting: Family Medicine

## 2021-03-09 ENCOUNTER — Encounter: Payer: Self-pay | Admitting: Family Medicine

## 2021-03-09 DIAGNOSIS — U071 COVID-19: Secondary | ICD-10-CM

## 2021-03-09 MED ORDER — DEXAMETHASONE 6 MG PO TABS: 6.0000 mg | ORAL_TABLET | Freq: Every day | ORAL | 0 refills | Status: AC

## 2021-03-09 NOTE — Progress Notes (Signed)
   Virtual Visit via Telephone Note  I connected with [name] on 03/09/21 at 3:55 PM by telephone and verified that I am speaking with the correct person using two identifiers. [name] is currently located at home and her grandson is currently with her during this visit. The provider, Gwenlyn Fudge, FNP is located in their office at time of visit.  I discussed the limitations, risks, security and privacy concerns of performing an evaluation and management service by telephone and the availability of in person appointments. I also discussed with the patient that there may be a patient responsible charge related to this service. The patient expressed understanding and agreed to proceed.  Subjective: PCP: Mechele Claude, MD  Chief Complaint  Patient presents with   URI   Patient complains of cough, fever, and congestion in her throat . Onset of symptoms was 4 days ago, gradually improving since that time. She is drinking plenty of fluids. Evaluation to date: at home COVID test was positive.  She does not smoke.    ROS: Per HPI  Current Outpatient Medications:    raloxifene (EVISTA) 60 MG tablet, TAKE 1 TABLET BY MOUTH EVERY DAY, Disp: 90 tablet, Rfl: 0  No Known Allergies Past Medical History:  Diagnosis Date   Allergy    seasonal   Cataract    Localized osteoporosis without current pathological fracture 12/29/2016   Murmur 05/18/2020    Observations/Objective: A&O  No respiratory distress or wheezing audible over the phone Mood, judgement, and thought processes all WNL  Assessment and Plan: 1. COVID-19 Discussed symptom management. Patient declined antiviral treatment. Discussed symptoms that should prompt her to go to the ER.  - dexamethasone (DECADRON) 6 MG tablet; Take 1 tablet (6 mg total) by mouth daily for 5 days.  Dispense: 5 tablet; Refill: 0   Follow Up Instructions:  I discussed the assessment and treatment plan with the patient. The patient was  provided an opportunity to ask questions and all were answered. The patient agreed with the plan and demonstrated an understanding of the instructions.   The patient was advised to call back or seek an in-person evaluation if the symptoms worsen or if the condition fails to improve as anticipated.  The above assessment and management plan was discussed with the patient. The patient verbalized understanding of and has agreed to the management plan. Patient is aware to call the clinic if symptoms persist or worsen. Patient is aware when to return to the clinic for a follow-up visit. Patient educated on when it is appropriate to go to the emergency department.   Time call ended: 4:06 PM  I provided 11 minutes of non-face-to-face time during this encounter.  Deliah Boston, MSN, APRN, FNP-C [facility] 03/09/21

## 2021-03-16 ENCOUNTER — Encounter: Payer: Self-pay | Admitting: Family Medicine

## 2021-03-16 ENCOUNTER — Ambulatory Visit (INDEPENDENT_AMBULATORY_CARE_PROVIDER_SITE_OTHER): Payer: Medicare Other

## 2021-03-16 ENCOUNTER — Ambulatory Visit (INDEPENDENT_AMBULATORY_CARE_PROVIDER_SITE_OTHER): Payer: Medicare Other | Admitting: Family Medicine

## 2021-03-16 ENCOUNTER — Other Ambulatory Visit: Payer: Self-pay

## 2021-03-16 VITALS — BP 130/72 | HR 81 | Temp 97.6°F | Ht 59.0 in | Wt 137.0 lb

## 2021-03-16 DIAGNOSIS — Z0001 Encounter for general adult medical examination with abnormal findings: Secondary | ICD-10-CM | POA: Diagnosis not present

## 2021-03-16 DIAGNOSIS — M816 Localized osteoporosis [Lequesne]: Secondary | ICD-10-CM | POA: Diagnosis not present

## 2021-03-16 DIAGNOSIS — Z0000 Encounter for general adult medical examination without abnormal findings: Secondary | ICD-10-CM

## 2021-03-16 DIAGNOSIS — Z1231 Encounter for screening mammogram for malignant neoplasm of breast: Secondary | ICD-10-CM | POA: Diagnosis not present

## 2021-03-16 DIAGNOSIS — Z780 Asymptomatic menopausal state: Secondary | ICD-10-CM | POA: Diagnosis not present

## 2021-03-16 DIAGNOSIS — E559 Vitamin D deficiency, unspecified: Secondary | ICD-10-CM

## 2021-03-16 DIAGNOSIS — R011 Cardiac murmur, unspecified: Secondary | ICD-10-CM

## 2021-03-16 DIAGNOSIS — I350 Nonrheumatic aortic (valve) stenosis: Secondary | ICD-10-CM | POA: Diagnosis not present

## 2021-03-16 DIAGNOSIS — M8589 Other specified disorders of bone density and structure, multiple sites: Secondary | ICD-10-CM | POA: Diagnosis not present

## 2021-03-16 MED ORDER — RALOXIFENE HCL 60 MG PO TABS: 60.0000 mg | ORAL_TABLET | Freq: Every day | ORAL | 3 refills | Status: DC

## 2021-03-16 NOTE — Progress Notes (Signed)
Subjective:  Patient ID: [name], female    DOB: Jun 07, 1941  Age: 80 y.o. MRN: [id number]  CC: Follow-up   HPI [name] presents for annual exam.  She is followed for osteoporosis.  She has not had any abnormal fractures.  She is due for bone density test.  She has not had a mammogram in over 2 years that will be ordered as well.  Patient denies any current symptoms. Patient also was found to have a heart murmur in the past and had an echocardiogram last fall under the care of Dr. Percival Spanish of cardiology.  That showed a mild to moderate aortic stenosis with a valve area of 1.46 cm.  The valve mean gradient was 14.2 mmHg and the peak gradient was 26.6 mm Hg.  She has had no symptoms referable to this including chest pain shortness of breath syncope or dizziness.    Depression screen [facility] 2/9 03/16/2021 03/16/2021 03/16/2020  Decreased Interest 3 0 0  Down, Depressed, Hopeless 0 0 0  PHQ - 2 Score 3 0 0  Altered sleeping 1 - -  Tired, decreased energy 1 - -  Change in appetite 1 - -  Feeling bad or failure about yourself  0 - -  Trouble concentrating 0 - -  Moving slowly or fidgety/restless 0 - -  Suicidal thoughts 0 - -  PHQ-9 Score 6 - -  Difficult doing work/chores Not difficult at all - -    History [name] has a past medical history of Allergy, Cataract, Localized osteoporosis without current pathological fracture (12/29/2016), and Murmur (05/18/2020).   She has a past surgical history that includes Cataract extraction, bilateral (2009); Urethral sling (2009); Cesarean section; and Uterine fibroid surgery.   Her family history includes COPD in her father; Heart disease in her mother.She reports that she has never smoked. She has never used smokeless tobacco. She reports that she does not drink alcohol and does not use drugs.    ROS Review of Systems  Constitutional: Negative.   HENT:  Negative for congestion.   Eyes:  Negative for visual disturbance.  Respiratory:   Negative for shortness of breath.   Cardiovascular:  Negative for chest pain.  Gastrointestinal:  Negative for abdominal pain, constipation, diarrhea, nausea and vomiting.  Genitourinary:  Negative for difficulty urinating.  Musculoskeletal:  Negative for arthralgias and myalgias.  Neurological:  Negative for headaches.  Psychiatric/Behavioral:  Negative for sleep disturbance.    Objective:  BP 130/72   Pulse 81   Temp 97.6 F (36.4 C)   Ht $R'4\' 11"'Lw$  (1.499 m)   Wt 137 lb (62.1 kg)   SpO2 99%   BMI 27.67 kg/m   BP Readings from Last 3 Encounters:  03/16/21 130/72  05/19/20 (!) 138/98  03/16/20 (!) 125/87    Wt Readings from Last 3 Encounters:  03/16/21 137 lb (62.1 kg)  05/19/20 143 lb (64.9 kg)  03/16/20 145 lb 6.4 oz (66 kg)     Physical Exam Constitutional:      General: She is not in acute distress.    Appearance: She is well-developed.  HENT:     Head: Normocephalic and atraumatic.  Eyes:     Conjunctiva/sclera: Conjunctivae normal.     Pupils: Pupils are equal, round, and reactive to light.  Neck:     Thyroid: No thyromegaly.  Cardiovascular:     Rate and Rhythm: Normal rate and regular rhythm.     Heart sounds: Murmur heard.  Pulmonary:  Effort: Pulmonary effort is normal. No respiratory distress.     Breath sounds: Normal breath sounds. No wheezing or rales.  Abdominal:     General: Bowel sounds are normal. There is no distension.     Palpations: Abdomen is soft.     Tenderness: There is no abdominal tenderness.  Musculoskeletal:        General: Normal range of motion.     Cervical back: Normal range of motion and neck supple.  Lymphadenopathy:     Cervical: No cervical adenopathy.  Skin:    General: Skin is warm and dry.  Neurological:     Mental Status: She is alert and oriented to person, place, and time.  Psychiatric:        Behavior: Behavior normal.        Thought Content: Thought content normal.        Judgment: Judgment normal.       Assessment & Plan:   [name] was seen today for follow-up.  Diagnoses and all orders for this visit:  Annual physical exam -     CBC with Differential/Platelet -     CMP14+EGFR -     Lipid panel -     CBC with Differential/Platelet -     CMP14+EGFR -     Lipid panel -     VITAMIN D 25 Hydroxy (Vit-D Deficiency, Fractures) -     Urinalysis  Vitamin D deficiency -     VITAMIN D 25 Hydroxy (Vit-D Deficiency, Fractures) -     CBC with Differential/Platelet -     CMP14+EGFR -     Lipid panel -     VITAMIN D 25 Hydroxy (Vit-D Deficiency, Fractures) -     Urinalysis  Localized osteoporosis without current pathological fracture -     Discontinue: raloxifene (EVISTA) 60 MG tablet; Take 1 tablet (60 mg total) by mouth daily. -     raloxifene (EVISTA) 60 MG tablet; Take 1 tablet (60 mg total) by mouth daily. -     CBC with Differential/Platelet -     CMP14+EGFR -     Lipid panel -     VITAMIN D 25 Hydroxy (Vit-D Deficiency, Fractures) -     Urinalysis -     DG Bone Density; Future  Murmur -     CBC with Differential/Platelet -     CMP14+EGFR -     Lipid panel -     VITAMIN D 25 Hydroxy (Vit-D Deficiency, Fractures) -     Urinalysis  Nonrheumatic aortic valve stenosis -     CBC with Differential/Platelet -     CMP14+EGFR -     Lipid panel -     VITAMIN D 25 Hydroxy (Vit-D Deficiency, Fractures) -     Urinalysis  Encounter for screening mammogram for malignant neoplasm of breast -     MM 3D SCREEN BREAST BILATERAL; Future  Post-menopause -     DG Bone Density; Future      I am having [name] maintain her raloxifene.  Allergies as of 03/16/2021   No Known Allergies      Medication List        Accurate as of March 16, 2021  2:02 PM. If you have any questions, ask your nurse or doctor.          raloxifene 60 MG tablet Commonly known as: EVISTA Take 1 tablet (60 mg total) by mouth daily.  Follow-up: Return in about 1 year (around  03/16/2022) for [facility] physical.  Claretta Fraise, M.D.

## 2021-05-02 ENCOUNTER — Ambulatory Visit
Admission: RE | Admit: 2021-05-02 | Discharge: 2021-05-02 | Disposition: A | Discharge status: Home / Self Care | Payer: Medicare Other | Source: Ambulatory Visit | Attending: Family Medicine | Admitting: Family Medicine

## 2021-05-02 ENCOUNTER — Other Ambulatory Visit: Payer: Self-pay

## 2021-05-02 DIAGNOSIS — Z1231 Encounter for screening mammogram for malignant neoplasm of breast: Secondary | ICD-10-CM | POA: Diagnosis not present

## 2021-06-22 ENCOUNTER — Telehealth: Payer: Self-pay | Admitting: Family Medicine

## 2021-06-22 NOTE — Telephone Encounter (Signed)
Tried calling patient to schedule Medicare Annual Wellness Visit (AWV) either virtually   No answer   *due 08/21/2009 awvi per [facility] ; please schedule at anytime with health coach  This should be a 45 minute visit.  

## 2021-09-09 ENCOUNTER — Telehealth: Payer: Self-pay | Admitting: Family Medicine

## 2021-09-09 NOTE — Telephone Encounter (Signed)
°  Left message for patient to call back and schedule Medicare Annual Wellness Visit (AWV) to be completed by video or phone. ° °No hx of AWV eligible for AWVI as of 08/21/2009 per [facility]  ° °Please schedule at anytime with [facility] Nurse Health Advisor --- Mary Jane ° °45 Minutes appointment  ° °Any questions, please call me at [phone]   °

## 2021-09-29 ENCOUNTER — Telehealth: Payer: Self-pay | Admitting: Family Medicine

## 2021-09-29 NOTE — Telephone Encounter (Signed)
°  Left message for patient to call back and schedule Medicare Annual Wellness Visit (AWV) to be completed by video or phone. ° °No hx of AWV eligible for AWVI as of 08/21/2009 per [facility]  ° °Please schedule at anytime with [facility] Nurse Health Advisor --- Mary Jane ° °45 Minutes appointment  ° °Any questions, please call me at [phone]   °

## 2021-10-05 ENCOUNTER — Encounter: Payer: Self-pay | Admitting: *Deleted

## 2021-10-27 ENCOUNTER — Telehealth: Payer: Self-pay | Admitting: Family Medicine

## 2021-10-27 NOTE — Telephone Encounter (Signed)
?  Left message for patient to call back and schedule Medicare Annual Wellness Visit (AWV) to be completed by video or phone. ? ?No hx of AWV eligible for AWVI per [employer] as of  08/21/2009 ? ?Please schedule at anytime with [facility] Nurse Health Advisor --- Mary Jane ? ?45 Minutes appointment  ? ?Any questions, please call me at [phone]   ?

## 2021-12-01 DIAGNOSIS — J0100 Acute maxillary sinusitis, unspecified: Secondary | ICD-10-CM | POA: Diagnosis not present

## 2021-12-01 DIAGNOSIS — R051 Acute cough: Secondary | ICD-10-CM | POA: Diagnosis not present

## 2022-01-23 ENCOUNTER — Telehealth: Payer: Self-pay | Admitting: Family Medicine

## 2022-01-23 NOTE — Telephone Encounter (Signed)
?  Left message for patient to call back and schedule Medicare Annual Wellness Visit (AWV) to be completed by video or phone. ? ?No hx of AWV eligible for AWVI per [employer] as of  08/21/2009 ? ?Please schedule at anytime with [facility] Nurse Health Advisor --- Mary Jane ? ?45 Minutes appointment  ? ?Any questions, please call me at [phone]   ?

## 2022-03-20 ENCOUNTER — Encounter: Payer: Medicare Other | Admitting: Family Medicine

## 2022-03-22 ENCOUNTER — Ambulatory Visit (INDEPENDENT_AMBULATORY_CARE_PROVIDER_SITE_OTHER): Payer: Medicare Other | Admitting: Family Medicine

## 2022-03-22 ENCOUNTER — Encounter: Payer: Self-pay | Admitting: Family Medicine

## 2022-03-22 VITALS — BP 158/79 | HR 84 | Temp 98.4°F | Ht 59.0 in | Wt 136.8 lb

## 2022-03-22 DIAGNOSIS — M816 Localized osteoporosis [Lequesne]: Secondary | ICD-10-CM | POA: Diagnosis not present

## 2022-03-22 DIAGNOSIS — Z136 Encounter for screening for cardiovascular disorders: Secondary | ICD-10-CM | POA: Diagnosis not present

## 2022-03-22 DIAGNOSIS — E559 Vitamin D deficiency, unspecified: Secondary | ICD-10-CM | POA: Diagnosis not present

## 2022-03-22 DIAGNOSIS — Z0000 Encounter for general adult medical examination without abnormal findings: Secondary | ICD-10-CM

## 2022-03-22 DIAGNOSIS — Z0001 Encounter for general adult medical examination with abnormal findings: Secondary | ICD-10-CM

## 2022-03-22 LAB — URINALYSIS
Bilirubin, UA: NEGATIVE
Glucose, UA: NEGATIVE
Ketones, UA: NEGATIVE
Nitrite, UA: NEGATIVE
Protein,UA: NEGATIVE
Specific Gravity, UA: 1.015 (ref 1.005–1.030)
Urobilinogen, Ur: 1 mg/dL (ref 0.2–1.0)
pH, UA: 6.5 (ref 5.0–7.5)

## 2022-03-22 MED ORDER — RALOXIFENE HCL 60 MG PO TABS: 60.0000 mg | ORAL_TABLET | Freq: Every day | ORAL | 3 refills | Status: DC

## 2022-03-22 NOTE — Progress Notes (Signed)
Subjective:  Patient ID: [name], female    DOB: 07-27-1941  Age: 81 y.o. MRN: [id number]  CC: Annual Exam   HPI [name] presents for annual exam. Husband passed away last 2023/07/12. Daughters, grandson help her with chores, etc.   Has osteoporosis. Taking raloxifene. Last DEXA was 7/22. Last Mammogram 9/22. Pt. Declines GYN including breast exam today.     03/22/2022    9:00 AM 03/16/2021    9:14 AM 03/16/2021    9:10 AM  Depression screen PHQ 2/9  Decreased Interest 0 3 0  Down, Depressed, Hopeless 0 0 0  PHQ - 2 Score 0 3 0  Altered sleeping  1   Tired, decreased energy  1   Change in appetite  1   Feeling bad or failure about yourself   0   Trouble concentrating  0   Moving slowly or fidgety/restless  0   Suicidal thoughts  0   PHQ-9 Score  6   Difficult doing work/chores  Not difficult at all     History Adeliz has a past medical history of Allergy, Cataract, Localized osteoporosis without current pathological fracture (12/29/2016), and Murmur (05/18/2020).   She has a past surgical history that includes Cataract extraction, bilateral (2009); Urethral sling (2009); Cesarean section; Uterine fibroid surgery; and Breast cyst excision (Right).   Her family history includes COPD in her father; Heart disease in her mother.She reports that she has never smoked. She has never used smokeless tobacco. She reports that she does not drink alcohol and does not use drugs.    ROS Review of Systems  Constitutional:  Negative for appetite change, chills, diaphoresis, fatigue, fever and unexpected weight change.  HENT:  Negative for congestion, ear pain, hearing loss, postnasal drip, rhinorrhea, sneezing, sore throat and trouble swallowing.   Eyes:  Negative for pain.  Respiratory:  Negative for cough, chest tightness and shortness of breath.   Cardiovascular:  Negative for chest pain and palpitations.  Gastrointestinal:  Negative for abdominal pain, constipation, diarrhea,  nausea and vomiting.  Endocrine: Negative for cold intolerance, heat intolerance, polydipsia, polyphagia and polyuria.  Genitourinary:  Negative for dysuria, frequency and menstrual problem.  Musculoskeletal:  Negative for arthralgias and joint swelling.  Skin:  Negative for rash.  Allergic/Immunologic: Negative for environmental allergies.  Neurological:  Negative for dizziness, weakness, numbness and headaches.  Psychiatric/Behavioral:  Negative for agitation and dysphoric mood.     Objective:  BP (!) 158/79   Pulse 84   Temp 98.4 F (36.9 C)   Ht _0  (1.499 m)   Wt 136 lb 12.8 oz (62.1 kg)   SpO2 98%   BMI 27.63 kg/m   BP Readings from Last 3 Encounters:  03/22/22 (!) 158/79  03/16/21 130/72  05/19/20 (!) 138/98    Wt Readings from Last 3 Encounters:  03/22/22 136 lb 12.8 oz (62.1 kg)  03/16/21 137 lb (62.1 kg)  05/19/20 143 lb (64.9 kg)     Physical Exam Constitutional:      General: She is not in acute distress.    Appearance: She is well-developed.  HENT:     Head: Normocephalic and atraumatic.  Eyes:     Conjunctiva/sclera: Conjunctivae normal.     Pupils: Pupils are equal, round, and reactive to light.  Neck:     Thyroid: No thyromegaly.  Cardiovascular:     Rate and Rhythm: Normal rate and regular rhythm.     Heart sounds: Murmur heard.  Crescendo systolic murmur is present with a grade of 3/6.     No friction rub. No gallop.  Pulmonary:     Effort: Pulmonary effort is normal. No respiratory distress.     Breath sounds: Normal breath sounds. No wheezing or rales.  Abdominal:     General: Bowel sounds are normal. There is no distension.     Palpations: Abdomen is soft.     Tenderness: There is no abdominal tenderness.  Musculoskeletal:        General: Normal range of motion.     Cervical back: Normal range of motion and neck supple.     Right lower leg: No edema.     Left lower leg: No edema.  Lymphadenopathy:     Cervical: No cervical  adenopathy.  Skin:    General: Skin is warm and dry.  Neurological:     Mental Status: She is alert and oriented to person, place, and time.  Psychiatric:        Behavior: Behavior normal.        Thought Content: Thought content normal.        Judgment: Judgment normal.       Assessment & Plan:   Sherrian was seen today for annual exam.  Diagnoses and all orders for this visit:  Annual physical exam -     CBC with Differential/Platelet -     CMP14+EGFR -     Lipid panel -     VITAMIN D 25 Hydroxy (Vit-D Deficiency, Fractures) -     Urinalysis  Vitamin D deficiency -     VITAMIN D 25 Hydroxy (Vit-D Deficiency, Fractures)  Localized osteoporosis without current pathological fracture -     raloxifene (EVISTA) 60 MG tablet; Take 1 tablet (60 mg total) by mouth daily.       I am having Nyari R. Pardon maintain her raloxifene.  Allergies as of 03/22/2022   No Known Allergies      Medication List        Accurate as of March 22, 2022  9:52 AM. If you have any questions, ask your nurse or doctor.          raloxifene 60 MG tablet Commonly known as: EVISTA Take 1 tablet (60 mg total) by mouth daily.         Follow-up: No follow-ups on file.  Claretta Fraise, M.D.

## 2022-03-23 LAB — CBC WITH DIFFERENTIAL/PLATELET
Basophils Absolute: 0.1 10*3/uL (ref 0.0–0.2)
Basos: 1 %
EOS (ABSOLUTE): 0.3 10*3/uL (ref 0.0–0.4)
Eos: 4 %
Hematocrit: 43.4 % (ref 34.0–46.6)
Hemoglobin: 14.9 g/dL (ref 11.1–15.9)
Immature Grans (Abs): 0 10*3/uL (ref 0.0–0.1)
Immature Granulocytes: 0 %
Lymphocytes Absolute: 2.1 10*3/uL (ref 0.7–3.1)
Lymphs: 28 %
MCH: 30.4 pg (ref 26.6–33.0)
MCHC: 34.3 g/dL (ref 31.5–35.7)
MCV: 89 fL (ref 79–97)
Monocytes Absolute: 0.6 10*3/uL (ref 0.1–0.9)
Monocytes: 8 %
Neutrophils Absolute: 4.4 10*3/uL (ref 1.4–7.0)
Neutrophils: 59 %
Platelets: 355 10*3/uL (ref 150–450)
RBC: 4.9 x10E6/uL (ref 3.77–5.28)
RDW: 13.4 % (ref 11.7–15.4)
WBC: 7.4 10*3/uL (ref 3.4–10.8)

## 2022-03-23 LAB — CMP14+EGFR
ALT: 14 IU/L (ref 0–32)
AST: 25 IU/L (ref 0–40)
Albumin/Globulin Ratio: 1.6 (ref 1.2–2.2)
Albumin: 4.2 g/dL (ref 3.7–4.7)
Alkaline Phosphatase: 94 IU/L (ref 44–121)
BUN/Creatinine Ratio: 13 (ref 12–28)
BUN: 10 mg/dL (ref 8–27)
Bilirubin Total: 1.4 mg/dL — ABNORMAL HIGH (ref 0.0–1.2)
CO2: 21 mmol/L (ref 20–29)
Calcium: 9.3 mg/dL (ref 8.7–10.3)
Chloride: 105 mmol/L (ref 96–106)
Creatinine, Ser: 0.76 mg/dL (ref 0.57–1.00)
Globulin, Total: 2.7 g/dL (ref 1.5–4.5)
Glucose: 94 mg/dL (ref 70–99)
Potassium: 4 mmol/L (ref 3.5–5.2)
Sodium: 141 mmol/L (ref 134–144)
Total Protein: 6.9 g/dL (ref 6.0–8.5)
eGFR: 78 mL/min/{1.73_m2} (ref >59)

## 2022-03-23 LAB — VITAMIN D 25 HYDROXY (VIT D DEFICIENCY, FRACTURES): Vit D, 25-Hydroxy: 34 ng/mL (ref 30.0–100.0)

## 2022-03-23 LAB — LIPID PANEL
Chol/HDL Ratio: 3.2 ratio (ref 0.0–4.4)
Cholesterol, Total: 228 mg/dL — ABNORMAL HIGH (ref 100–199)
HDL: 71 mg/dL (ref >39)
LDL Chol Calc (NIH): 138 mg/dL — ABNORMAL HIGH (ref 0–99)
Triglycerides: 107 mg/dL (ref 0–149)
VLDL Cholesterol Cal: 19 mg/dL (ref 5–40)

## 2022-04-25 ENCOUNTER — Other Ambulatory Visit: Payer: Self-pay | Admitting: Family Medicine

## 2022-04-25 DIAGNOSIS — Z1231 Encounter for screening mammogram for malignant neoplasm of breast: Secondary | ICD-10-CM

## 2022-05-10 ENCOUNTER — Ambulatory Visit
Admission: RE | Admit: 2022-05-10 | Discharge: 2022-05-10 | Disposition: A | Discharge status: Home / Self Care | Payer: Medicare Other | Source: Ambulatory Visit | Attending: Family Medicine | Admitting: Family Medicine

## 2022-05-10 DIAGNOSIS — Z1231 Encounter for screening mammogram for malignant neoplasm of breast: Secondary | ICD-10-CM | POA: Diagnosis not present

## 2022-11-01 IMAGING — MG MM DIGITAL SCREENING BILAT W/ TOMO AND CAD
8 series · 9 of 24 positions shown · non-contrast
Comparison: Previous exams 08/26/2018 and earlier from [HOSPITAL]
[facility].

CLINICAL DATA: Screening.

EXAM:
DIGITAL SCREENING BILATERAL MAMMOGRAM WITH TOMOSYNTHESIS AND CAD
TECHNIQUE: Bilateral screening digital craniocaudal and mediolateral oblique
mammograms were obtained. Bilateral screening digital breast
tomosynthesis was performed. The images were evaluated with
computer-aided detection.

[L MLO synth-2D]
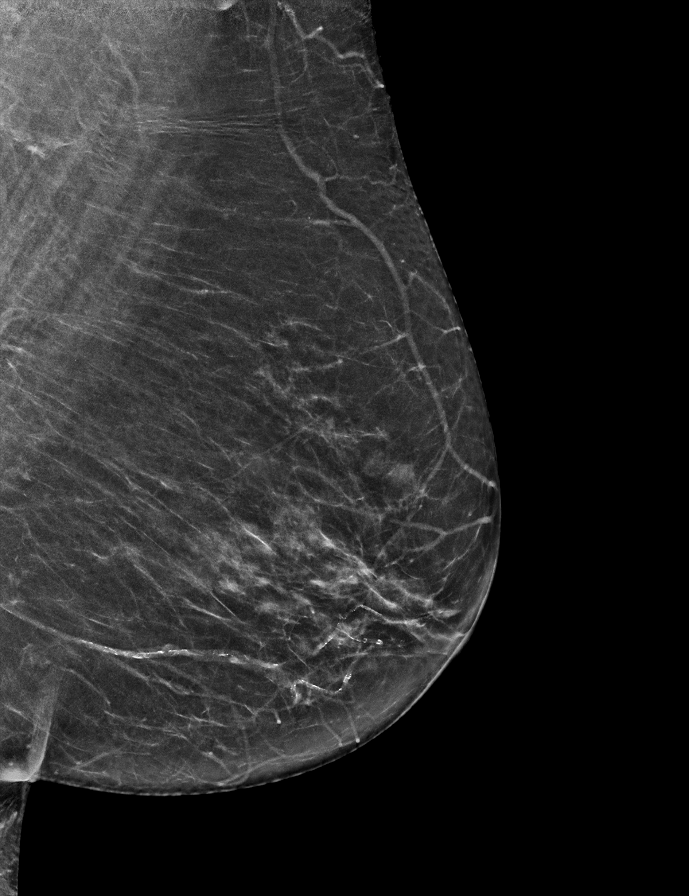

[L CC synth-2D]
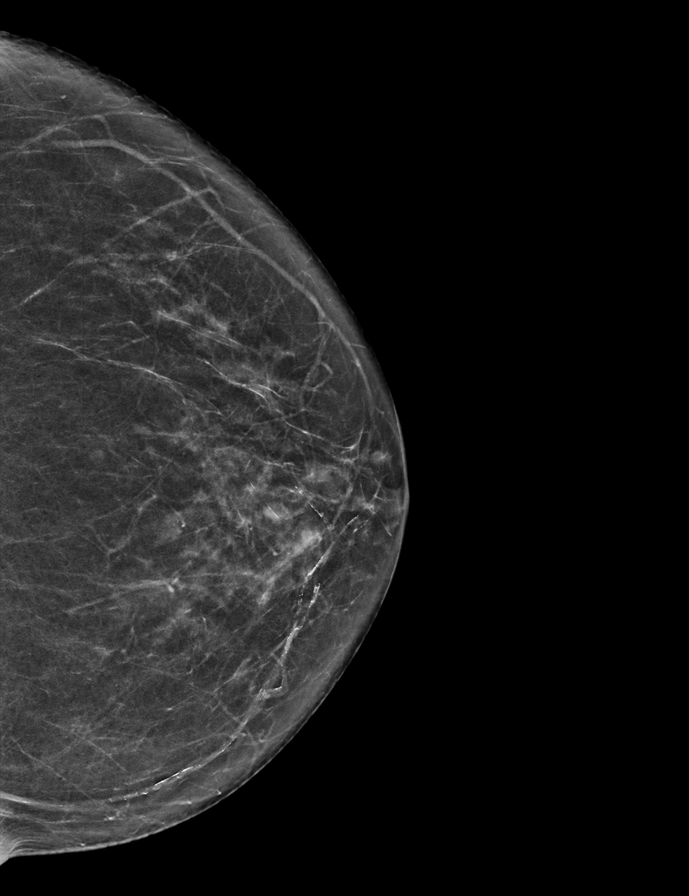

[R MLO synth-2D]
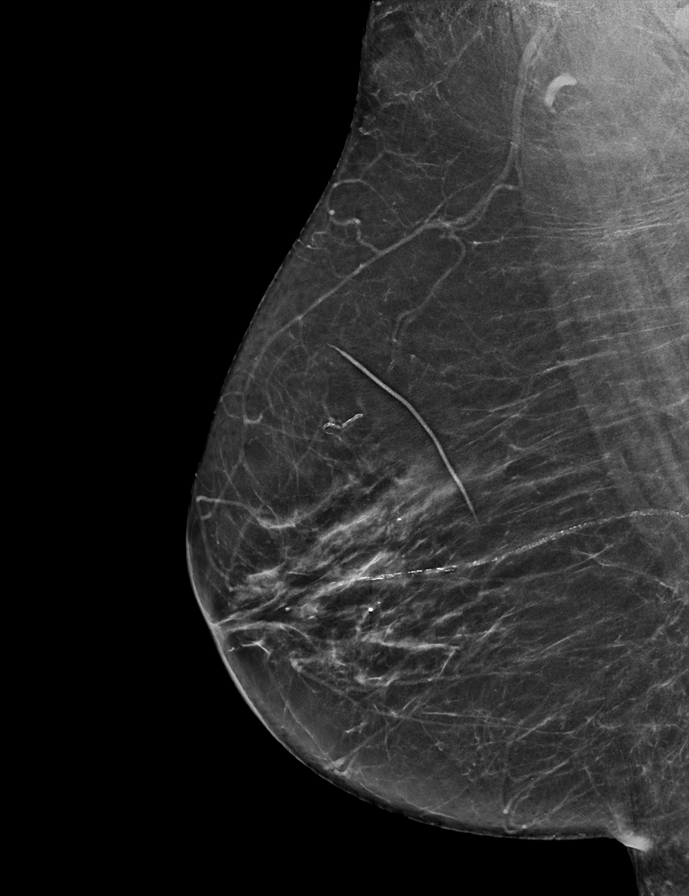

[R CC synth-2D]
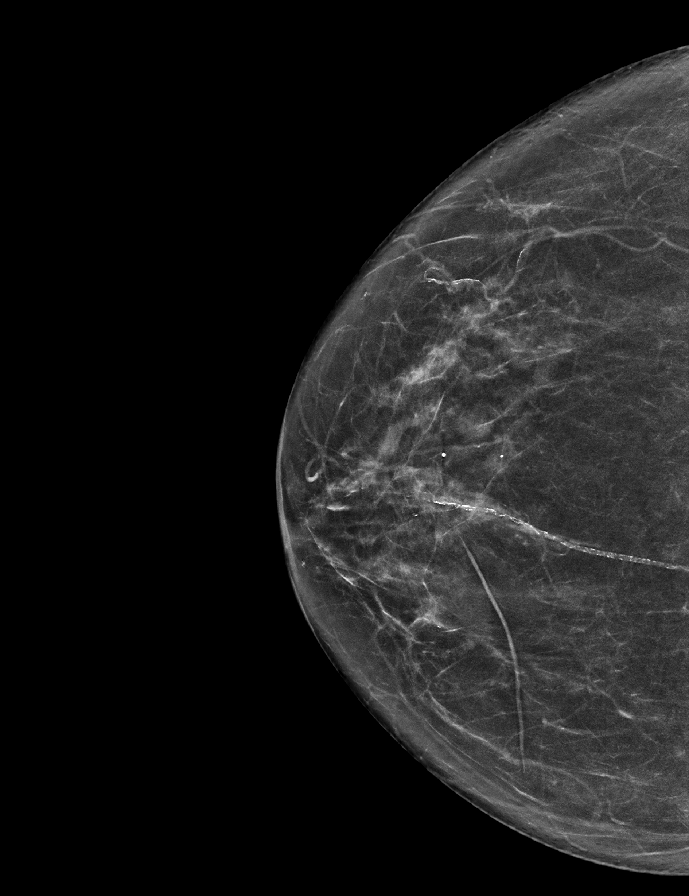

[R CC tomo · 2 of 60 frames shown]
[frame 20/60]
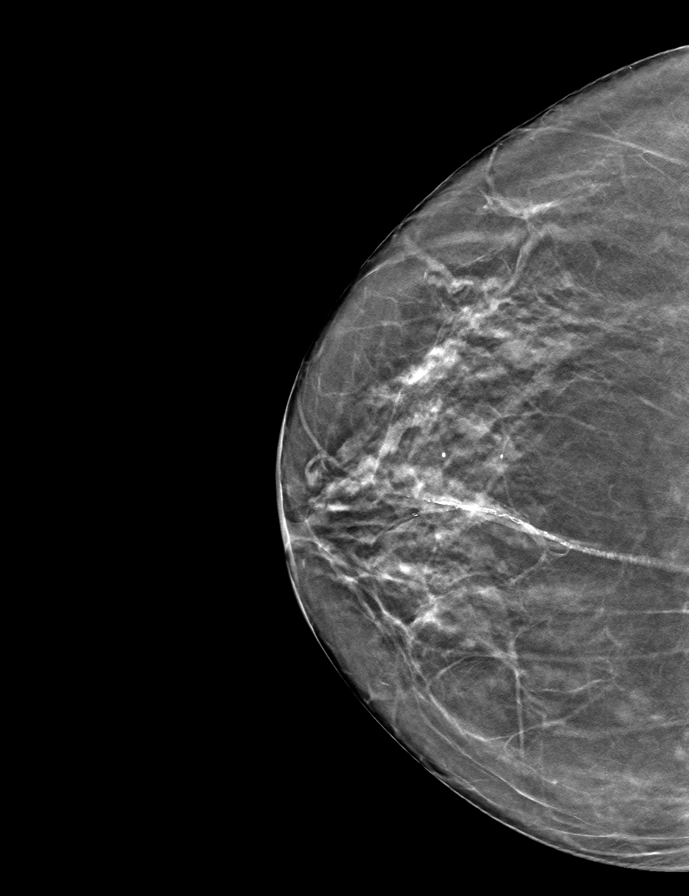
[frame 31/60]
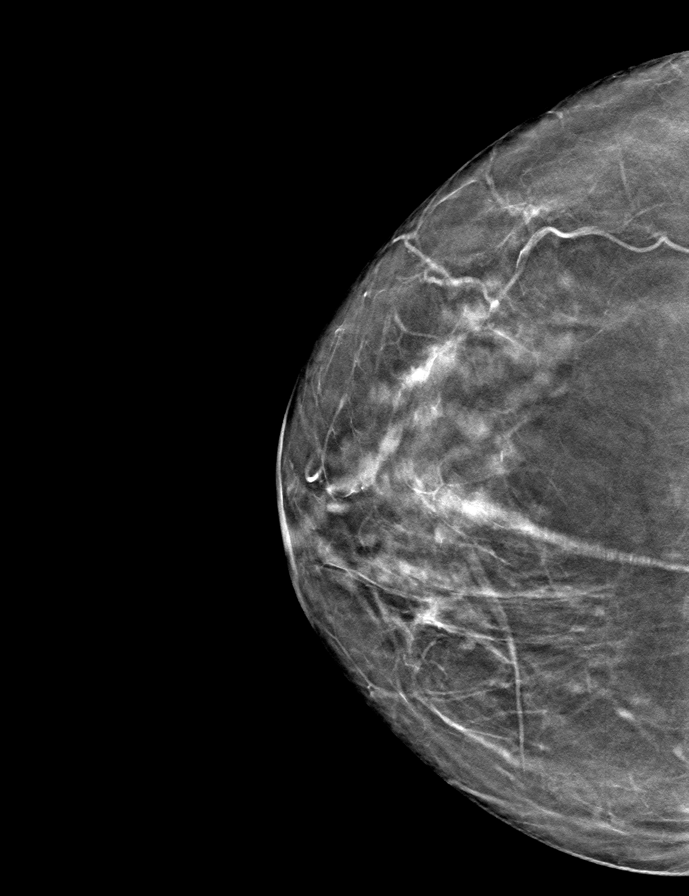

[L CC tomo · tomo slice 30/59.0]
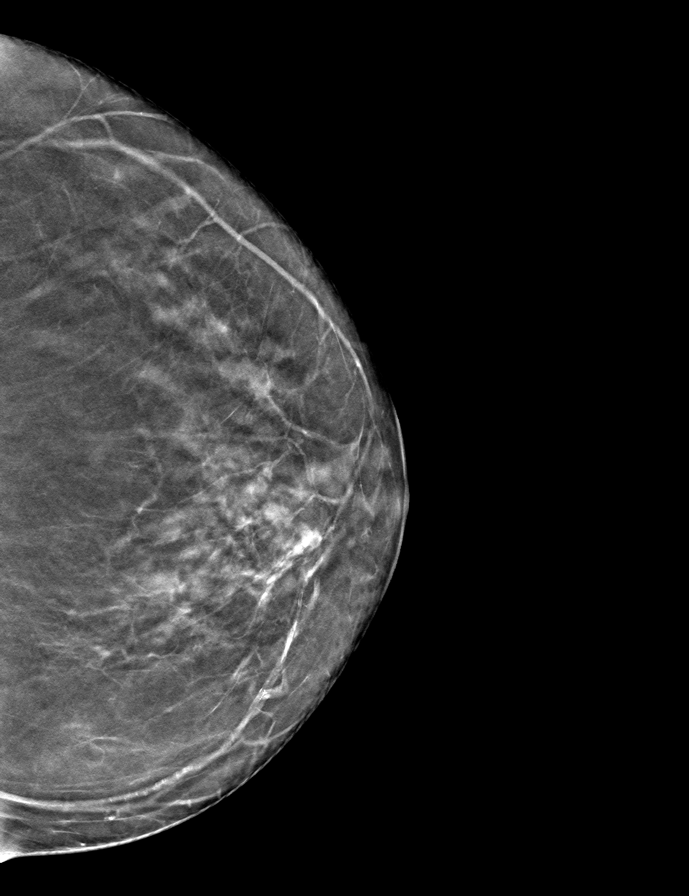

[L MLO tomo · tomo slice 30/59.0]
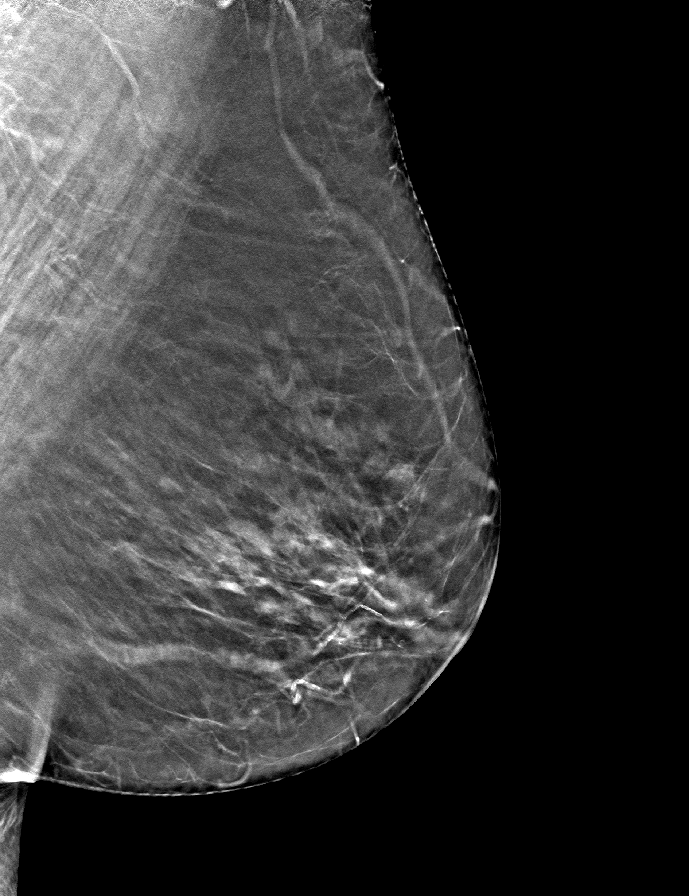

[R MLO tomo · tomo slice 31/61.0]
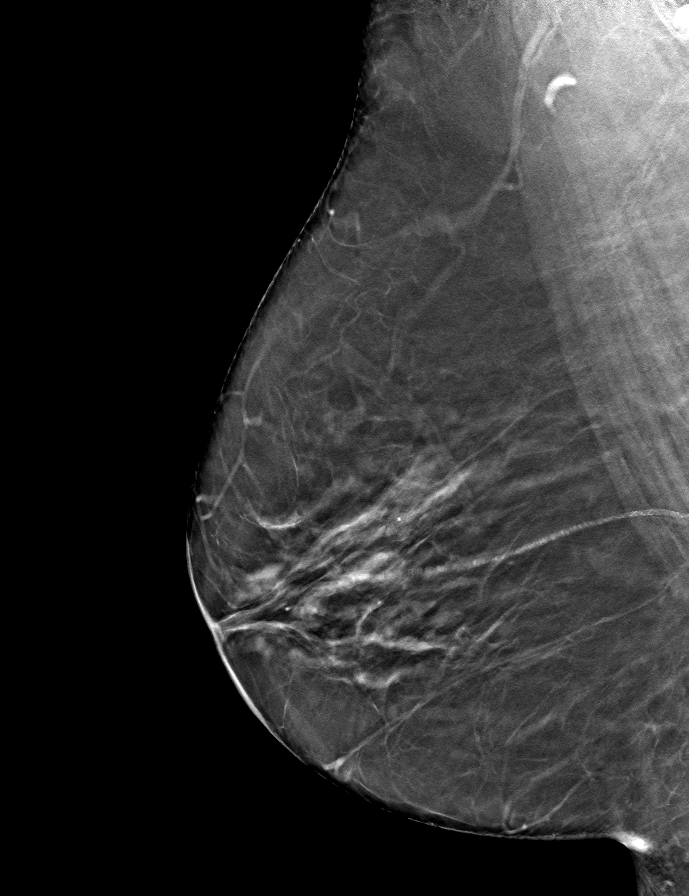

[9 of 24 positions shown; findings below may reference images not displayed]

Awaiting the prior outside mammograms accounts for the
delay in this report.

ACR Breast Density Category b: There are scattered areas of
fibroglandular density.
FINDINGS: There are no findings suspicious for malignancy.
IMPRESSION: No mammographic evidence of malignancy. A result letter of this
screening mammogram will be mailed directly to the patient.

RECOMMENDATION:
Screening mammogram in one year. (Code:5I-7-HVZ)

BI-RADS CATEGORY  1: Negative.

## 2023-03-26 ENCOUNTER — Encounter: Payer: Self-pay | Admitting: Family Medicine

## 2023-03-26 ENCOUNTER — Other Ambulatory Visit: Payer: Self-pay

## 2023-03-26 ENCOUNTER — Ambulatory Visit (INDEPENDENT_AMBULATORY_CARE_PROVIDER_SITE_OTHER): Payer: Medicare Other | Admitting: Family Medicine

## 2023-03-26 ENCOUNTER — Ambulatory Visit (INDEPENDENT_AMBULATORY_CARE_PROVIDER_SITE_OTHER): Payer: Medicare Other

## 2023-03-26 VITALS — BP 147/78 | HR 72 | Temp 96.9°F | Ht 59.0 in | Wt 135.4 lb

## 2023-03-26 DIAGNOSIS — M816 Localized osteoporosis [Lequesne]: Secondary | ICD-10-CM

## 2023-03-26 DIAGNOSIS — Z0001 Encounter for general adult medical examination with abnormal findings: Secondary | ICD-10-CM

## 2023-03-26 DIAGNOSIS — M810 Age-related osteoporosis without current pathological fracture: Secondary | ICD-10-CM | POA: Diagnosis not present

## 2023-03-26 DIAGNOSIS — Z780 Asymptomatic menopausal state: Secondary | ICD-10-CM | POA: Diagnosis not present

## 2023-03-26 DIAGNOSIS — D485 Neoplasm of uncertain behavior of skin: Secondary | ICD-10-CM | POA: Diagnosis not present

## 2023-03-26 DIAGNOSIS — Z23 Encounter for immunization: Secondary | ICD-10-CM | POA: Diagnosis not present

## 2023-03-26 DIAGNOSIS — Z0000 Encounter for general adult medical examination without abnormal findings: Secondary | ICD-10-CM

## 2023-03-26 MED ORDER — RALOXIFENE HCL 60 MG PO TABS: 60.0000 mg | ORAL_TABLET | Freq: Every day | ORAL | 3 refills | Status: DC

## 2023-03-26 NOTE — Progress Notes (Signed)
Subjective:  Patient ID: [name], female    DOB: 03/11/1941  Age: 82 y.o. MRN: [id number]  CC: Annual Exam   HPI [name] presents for annua exam     03/26/2023    9:07 AM 03/26/2023    8:58 AM 03/22/2022    9:00 AM  Depression screen PHQ 2/9  Decreased Interest 0 0 0  Down, Depressed, Hopeless 0 0 0  PHQ - 2 Score 0 0 0    History [name] has a past medical history of Allergy, Cataract, Localized osteoporosis without current pathological fracture (12/29/2016), and Murmur (05/18/2020).   She has a past surgical history that includes Cataract extraction, bilateral (2009); Urethral sling (2009); Cesarean section; Uterine fibroid surgery; and Breast cyst excision (Right).   Her family history includes COPD in her father; Heart disease in her mother.She reports that she has never smoked. She has never used smokeless tobacco. She reports that she does not drink alcohol and does not use drugs.    ROS Review of Systems  Constitutional:  Negative for appetite change, chills, diaphoresis, fatigue, fever and unexpected weight change.  HENT:  Positive for hearing loss (hearing aides). Negative for congestion, ear pain, postnasal drip, rhinorrhea, sneezing, sore throat and trouble swallowing.   Eyes:  Negative for pain.  Respiratory:  Positive for cough. Negative for chest tightness and shortness of breath.   Cardiovascular:  Negative for chest pain and palpitations.  Gastrointestinal:  Negative for abdominal pain, constipation, diarrhea, nausea and vomiting.  Endocrine: Negative for cold intolerance, heat intolerance, polydipsia, polyphagia and polyuria.  Genitourinary:  Negative for dysuria, frequency and menstrual problem.       Mild stress incontinuence with lifting heavy objects (mop bucket)   Musculoskeletal:  Negative for arthralgias and joint swelling.  Skin:  Negative for rash.  Allergic/Immunologic: Negative for environmental allergies.  Neurological:  Negative for  dizziness, weakness, numbness and headaches.  Psychiatric/Behavioral:  Negative for agitation and dysphoric mood.     Objective:  BP (!) 147/78   Pulse 72   Temp (!) 96.9 F (36.1 C)   Ht 4\' 11"  (1.499 m)   Wt 135 lb 6.4 oz (61.4 kg)   SpO2 96%   BMI 27.35 kg/m   BP Readings from Last 3 Encounters:  03/26/23 (!) 147/78  03/22/22 (!) 158/79  03/16/21 130/72    Wt Readings from Last 3 Encounters:  03/26/23 135 lb 6.4 oz (61.4 kg)  03/22/22 136 lb 12.8 oz (62.1 kg)  03/16/21 137 lb (62.1 kg)     Physical Exam Constitutional:      General: She is not in acute distress.    Appearance: She is well-developed.  HENT:     Head: Normocephalic and atraumatic.  Eyes:     Conjunctiva/sclera: Conjunctivae normal.     Pupils: Pupils are equal, round, and reactive to light.  Neck:     Thyroid: No thyromegaly.  Cardiovascular:     Rate and Rhythm: Normal rate and regular rhythm.     Heart sounds: Normal heart sounds. No murmur heard. Pulmonary:     Effort: Pulmonary effort is normal. No respiratory distress.     Breath sounds: Normal breath sounds. No wheezing or rales.  Chest:  Breasts:    Right: Normal. No mass.     Left: Normal. No mass.  Abdominal:     General: Bowel sounds are normal. There is no distension.     Palpations: Abdomen is soft.  Tenderness: There is no abdominal tenderness.  Musculoskeletal:        General: Normal range of motion.     Cervical back: Normal range of motion and neck supple.  Lymphadenopathy:     Cervical: No cervical adenopathy.  Skin:    General: Skin is warm and dry.     Findings: Lesion (9mm pearlescent at left brow) present.  Neurological:     Mental Status: She is alert and oriented to person, place, and time.  Psychiatric:        Behavior: Behavior normal.        Thought Content: Thought content normal.        Judgment: Judgment normal.       Assessment & Plan:   [name] was seen today for annual exam.  Diagnoses and  all orders for this visit:  Annual physical exam -     CBC with Differential/Platelet -     CMP14+EGFR -     Lipid panel -     Urinalysis -     VITAMIN D 25 Hydroxy (Vit-D Deficiency, Fractures)  Localized osteoporosis without current pathological fracture -     raloxifene (EVISTA) 60 MG tablet; Take 1 tablet (60 mg total) by mouth daily. -     DG Bone Density; Future  Neoplasm of uncertain behavior of skin -     Ambulatory referral to Dermatology  Need for pneumococcal 20-valent conjugate vaccination -     Pneumococcal conjugate vaccine 20-valent (Prevnar 20)       I am having [name] maintain her raloxifene.  Allergies as of 03/26/2023   No Known Allergies      Medication List        Accurate as of March 26, 2023  8:12 PM. If you have any questions, ask your nurse or doctor.          raloxifene 60 MG tablet Commonly known as: EVISTA Take 1 tablet (60 mg total) by mouth daily.         Follow-up: No follow-ups on file.  Mechele Claude, M.D.

## 2023-04-06 ENCOUNTER — Telehealth: Payer: Self-pay

## 2023-04-06 NOTE — Telephone Encounter (Signed)
I contacted patient in regards to making DXA appt.  Patient would like an update on referral for Dermatology - it has been over a week and she has not heard anything.

## 2023-04-09 ENCOUNTER — Ambulatory Visit: Payer: Medicare Other

## 2023-04-09 NOTE — Telephone Encounter (Signed)
My Chart Message sent to Patient in regards to Dermatology Referral. Dr. Scharlene Gloss Office contact information was provided.

## 2023-04-10 ENCOUNTER — Ambulatory Visit (INDEPENDENT_AMBULATORY_CARE_PROVIDER_SITE_OTHER): Payer: Medicare Other

## 2023-04-10 VITALS — Ht <= 58 in | Wt 135.0 lb

## 2023-04-10 DIAGNOSIS — Z0000 Encounter for general adult medical examination without abnormal findings: Secondary | ICD-10-CM

## 2023-04-10 NOTE — Patient Instructions (Signed)
[name] , Thank you for taking time to come for your Medicare Wellness Visit. I appreciate your ongoing commitment to your health goals. Please review the following plan we discussed and let me know if I can assist you in the future.   Referrals/Orders/Follow-Ups/Clinician Recommendations: Aim for 30 minutes of exercise or brisk walking, 6-8 glasses of water, and 5 servings of fruits and vegetables each day.   This is a list of the screening recommended for you and due dates:  Health Maintenance  Topic Date Due   DTaP/Tdap/Td vaccine (1 - Tdap) Never done   Flu Shot  03/22/2023   COVID-19 Vaccine (3 - Moderna risk series) 04/11/2023*   Zoster (Shingles) Vaccine (1 of 2) 06/26/2023*   Mammogram  05/11/2023   Medicare Annual Wellness Visit  04/09/2024   Pneumonia Vaccine  Completed   DEXA scan (bone density measurement)  Completed   HPV Vaccine  Aged Out  *Topic was postponed. The date shown is not the original due date.    Advanced directives: (Copy Requested) Please bring a copy of your health care power of attorney and living will to the office to be added to your chart at your convenience.  Next Medicare Annual Wellness Visit scheduled for next year: Yes  Preventive Care 35 Years and Older, Female Preventive care refers to lifestyle choices and visits with your health care provider that can promote health and wellness. What does preventive care include? A yearly physical exam. This is also called an annual well check. Dental exams once or twice a year. Routine eye exams. Ask your health care provider how often you should have your eyes checked. Personal lifestyle choices, including: Daily care of your teeth and gums. Regular physical activity. Eating a healthy diet. Avoiding tobacco and drug use. Limiting alcohol use. Practicing safe sex. Taking low-dose aspirin every day. Taking vitamin and mineral supplements as recommended by your health care provider. What happens  during an annual well check? The services and screenings done by your health care provider during your annual well check will depend on your age, overall health, lifestyle risk factors, and family history of disease. Counseling  Your health care provider may ask you questions about your: Alcohol use. Tobacco use. Drug use. Emotional well-being. Home and relationship well-being. Sexual activity. Eating habits. History of falls. Memory and ability to understand (cognition). Work and work Astronomer. Reproductive health. Screening  You may have the following tests or measurements: Height, weight, and BMI. Blood pressure. Lipid and cholesterol levels. These may be checked every 5 years, or more frequently if you are over 31 years old. Skin check. Lung cancer screening. You may have this screening every year starting at age 46 if you have a 30-pack-year history of smoking and currently smoke or have quit within the past 15 years. Fecal occult blood test (FOBT) of the stool. You may have this test every year starting at age 12. Flexible sigmoidoscopy or colonoscopy. You may have a sigmoidoscopy every 5 years or a colonoscopy every 10 years starting at age 21. Hepatitis C blood test. Hepatitis B blood test. Sexually transmitted disease (STD) testing. Diabetes screening. This is done by checking your blood sugar (glucose) after you have not eaten for a while (fasting). You may have this done every 1-3 years. Bone density scan. This is done to screen for osteoporosis. You may have this done starting at age 77. Mammogram. This may be done every 1-2 years. Talk to your health care provider about how  often you should have regular mammograms. Talk with your health care provider about your test results, treatment options, and if necessary, the need for more tests. Vaccines  Your health care provider may recommend certain vaccines, such as: Influenza vaccine. This is recommended every  year. Tetanus, diphtheria, and acellular pertussis (Tdap, Td) vaccine. You may need a Td booster every 10 years. Zoster vaccine. You may need this after age 44. Pneumococcal 13-valent conjugate (PCV13) vaccine. One dose is recommended after age 38. Pneumococcal polysaccharide (PPSV23) vaccine. One dose is recommended after age 34. Talk to your health care provider about which screenings and vaccines you need and how often you need them. This information is not intended to replace advice given to you by your health care provider. Make sure you discuss any questions you have with your health care provider. Document Released: 09/03/2015 Document Revised: 04/26/2016 Document Reviewed: 06/08/2015 Elsevier Interactive Patient Education  2017 [employer].  Fall Prevention in the Home Falls can cause injuries. They can happen to people of all ages. There are many things you can do to make your home safe and to help prevent falls. What can I do on the outside of my home? Regularly fix the edges of walkways and driveways and fix any cracks. Remove anything that might make you trip as you walk through a door, such as a raised step or threshold. Trim any bushes or trees on the path to your home. Use bright outdoor lighting. Clear any walking paths of anything that might make someone trip, such as rocks or tools. Regularly check to see if handrails are loose or broken. Make sure that both sides of any steps have handrails. Any raised decks and porches should have guardrails on the edges. Have any leaves, snow, or ice cleared regularly. Use sand or salt on walking paths during winter. Clean up any spills in your garage right away. This includes oil or grease spills. What can I do in the bathroom? Use night lights. Install grab bars by the toilet and in the tub and shower. Do not use towel bars as grab bars. Use non-skid mats or decals in the tub or shower. If you need to sit down in the shower, use a  plastic, non-slip stool. Keep the floor dry. Clean up any water that spills on the floor as soon as it happens. Remove soap buildup in the tub or shower regularly. Attach bath mats securely with double-sided non-slip rug tape. Do not have throw rugs and other things on the floor that can make you trip. What can I do in the bedroom? Use night lights. Make sure that you have a light by your bed that is easy to reach. Do not use any sheets or blankets that are too big for your bed. They should not hang down onto the floor. Have a firm chair that has side arms. You can use this for support while you get dressed. Do not have throw rugs and other things on the floor that can make you trip. What can I do in the kitchen? Clean up any spills right away. Avoid walking on wet floors. Keep items that you use a lot in easy-to-reach places. If you need to reach something above you, use a strong step stool that has a grab bar. Keep electrical cords out of the way. Do not use floor polish or wax that makes floors slippery. If you must use wax, use non-skid floor wax. Do not have throw rugs and other things  on the floor that can make you trip. What can I do with my stairs? Do not leave any items on the stairs. Make sure that there are handrails on both sides of the stairs and use them. Fix handrails that are broken or loose. Make sure that handrails are as long as the stairways. Check any carpeting to make sure that it is firmly attached to the stairs. Fix any carpet that is loose or worn. Avoid having throw rugs at the top or bottom of the stairs. If you do have throw rugs, attach them to the floor with carpet tape. Make sure that you have a light switch at the top of the stairs and the bottom of the stairs. If you do not have them, ask someone to add them for you. What else can I do to help prevent falls? Wear shoes that: Do not have high heels. Have rubber bottoms. Are comfortable and fit you  well. Are closed at the toe. Do not wear sandals. If you use a stepladder: Make sure that it is fully opened. Do not climb a closed stepladder. Make sure that both sides of the stepladder are locked into place. Ask someone to hold it for you, if possible. Clearly mark and make sure that you can see: Any grab bars or handrails. First and last steps. Where the edge of each step is. Use tools that help you move around (mobility aids) if they are needed. These include: Canes. Walkers. Scooters. Crutches. Turn on the lights when you go into a dark area. Replace any light bulbs as soon as they burn out. Set up your furniture so you have a clear path. Avoid moving your furniture around. If any of your floors are uneven, fix them. If there are any pets around you, be aware of where they are. Review your medicines with your doctor. Some medicines can make you feel dizzy. This can increase your chance of falling. Ask your doctor what other things that you can do to help prevent falls. This information is not intended to replace advice given to you by your health care provider. Make sure you discuss any questions you have with your health care provider. Document Released: 06/03/2009 Document Revised: 01/13/2016 Document Reviewed: 09/11/2014 Elsevier Interactive Patient Education  2017 [employer].

## 2023-04-10 NOTE — Progress Notes (Signed)
Subjective:   [name] is a 82 y.o. female who presents for an Initial Medicare Annual Wellness Visit.  Visit Complete: Virtual  I connected with  [name] on 04/10/23 by a audio enabled telemedicine application and verified that I am speaking with the correct person using two identifiers.  Patient Location: Home  Provider Location: Home Office  I discussed the limitations of evaluation and management by telemedicine. The patient expressed understanding and agreed to proceed.  Patient Medicare AWV questionnaire was completed by the patient on 04/10/2023; I have confirmed that all information answered by patient is correct and no changes since this date.  Review of Systems    Vital Signs: Unable to obtain new vitals due to this being a telehealth visit.  Cardiac Risk Factors include: advanced Daugherty (>43men, >39 women)     Objective:    Today's Vitals   04/10/23 1305  Weight: 135 lb (61.2 kg)  Height: 4\' 9"  (1.448 m)   Body mass index is 29.21 kg/m.     04/10/2023    1:09 PM  Advanced Directives  Does Patient Have a Medical Advance Directive? Yes  Type of Estate agent of [place];Living will  Copy of Healthcare Power of Attorney in Chart? No - copy requested    Current Medications (verified) Outpatient Encounter Medications as of 04/10/2023  Medication Sig   raloxifene (EVISTA) 60 MG tablet Take 1 tablet (60 mg total) by mouth daily.   No facility-administered encounter medications on file as of 04/10/2023.    Allergies (verified) Patient has no known allergies.   History: Past Medical History:  Diagnosis Date   Allergy    seasonal   Cataract    Localized osteoporosis without current pathological fracture 12/29/2016   Murmur 05/18/2020   Past Surgical History:  Procedure Laterality Date   BREAST CYST EXCISION Right    CATARACT EXTRACTION, BILATERAL  2009   CESAREAN SECTION     URETHRAL SLING  2009   UTERINE FIBROID SURGERY      Family History  Problem Relation Daugherty of Onset   Heart disease Mother        No details.  Died Daugherty 53.   COPD Father    Breast cancer Neg Hx    Social History   Socioeconomic History   Marital status: Married    Spouse name: Not on file   Number of children: Not on file   Years of education: Not on file   Highest education level: Not on file  Occupational History   Not on file  Tobacco Use   Smoking status: Never   Smokeless tobacco: Never  Vaping Use   Vaping status: Never Used  Substance and Sexual Activity   Alcohol use: No   Drug use: No   Sexual activity: Never  Other Topics Concern   Not on file  Social History Narrative   Lives with husband and dog.    Social Determinants of Health   Financial Resource Strain: Low Risk  (04/10/2023)   Overall Financial Resource Strain (CARDIA)    Difficulty of Paying Living Expenses: Not hard at all  Food Insecurity: No Food Insecurity (04/10/2023)   Hunger Vital Sign    Worried About Running Out of Food in the Last Year: Never true    Ran Out of Food in the Last Year: Never true  Transportation Needs: No Transportation Needs (04/10/2023)   PRAPARE - Administrator, Civil Service (Medical): No  Lack of Transportation (Non-Medical): No  Physical Activity: Insufficiently Active (04/10/2023)   Exercise Vital Sign    Days of Exercise per Week: 3 days    Minutes of Exercise per Session: 30 min  Stress: No Stress Concern Present (04/10/2023)   [employer] of Occupational Health - Occupational Stress Questionnaire    Feeling of Stress : Not at all  Social Connections: Socially Integrated (04/10/2023)   Social Connection and Isolation Panel [NHANES]    Frequency of Communication with Friends and Family: More than three times a week    Frequency of Social Gatherings with Friends and Family: More than three times a week    Attends Religious Services: More than 4 times per year    Active Member of [employer] or  Organizations: Yes    Attends Engineer, structural: More than 4 times per year    Marital Status: Married    Tobacco Counseling Counseling given: Not Answered   Clinical Intake:  Pre-visit preparation completed: Yes  Pain : No/denies pain     Nutritional Risks: None Diabetes: No  How often do you need to have someone help you when you read instructions, pamphlets, or other written materials from your doctor or pharmacy?: 1 - Never  Interpreter Needed?: No  Information entered by :: Renie Ora, LPN   Activities of Daily Living    04/10/2023    1:09 PM  In your present state of health, do you have any difficulty performing the following activities:  Hearing? 0  Vision? 0  Difficulty concentrating or making decisions? 0  Walking or climbing stairs? 0  Dressing or bathing? 0  Doing errands, shopping? 0  Preparing Food and eating ? N  Using the Toilet? N  In the past six months, have you accidently leaked urine? N  Do you have problems with loss of bowel control? N  Managing your Medications? N  Managing your Finances? N  Housekeeping or managing your Housekeeping? N    Patient Care Team: Mechele Claude, MD as PCP - General (Family Medicine)  Indicate any recent Medical Services you may have received from other than [facility] providers in the past year (date may be approximate).     Assessment:   This is a routine wellness examination for [name].  Hearing/Vision screen Vision Screening - Comments:: Wears rx glasses - up to date with routine eye exams with  Dr.Davis   Dietary issues and exercise activities discussed:     Goals Addressed             This Visit's Progress    DIET - INCREASE WATER INTAKE         Depression Screen    04/10/2023    1:08 PM 03/26/2023    9:07 AM 03/26/2023    8:58 AM 03/22/2022    9:00 AM 03/16/2021    9:14 AM 03/16/2021    9:10 AM 03/16/2020    1:23 PM  PHQ 2/9 Scores  PHQ - 2 Score 0 0 0 0 3 0 0  PHQ- 9 Score     6       Fall Risk    04/10/2023    1:07 PM 04/10/2023    1:06 PM 03/26/2023    9:07 AM 03/26/2023    8:58 AM 03/22/2022    9:18 AM  Fall Risk   Falls in the past year? 0 0 1 0 1  Number falls in past yr: 0 0 0  1  Injury with Fall?  0 0 1  1  Risk for fall due to : No Fall Risks No Fall Risks History of fall(s)  History of fall(s)  Follow up Falls prevention discussed Falls prevention discussed Falls evaluation completed  Falls evaluation completed    MEDICARE RISK AT HOME: Medicare Risk at Home Any stairs in or around the home?: No If so, are there any without handrails?: No Home free of loose throw rugs in walkways, pet beds, electrical cords, etc?: Yes Adequate lighting in your home to reduce risk of falls?: Yes Life alert?: No Use of a cane, walker or w/c?: No Grab bars in the bathroom?: Yes Shower chair or bench in shower?: Yes Elevated toilet seat or a handicapped toilet?: Yes  TIMED UP AND GO:  Was the test performed? No    Cognitive Function:        04/10/2023    1:09 PM  6CIT Screen  What Year? 0 points  What month? 0 points  What time? 0 points  Count back from 20 0 points  Months in reverse 0 points  Repeat phrase 2 points  Total Score 2 points    Immunizations Immunization History  Administered Date(s) Administered   Influenza, High Dose Seasonal PF 05/25/2016, 06/12/2018, 07/23/2019   Influenza-Unspecified 06/03/2017   Moderna Sars-Covid-2 Vaccination 11/03/2019, 12/01/2019   PNEUMOCOCCAL CONJUGATE-20 03/26/2023   Zoster, Live 07/14/2014    TDAP status: Due, Education has been provided regarding the importance of this vaccine. Advised may receive this vaccine at local pharmacy or Health Dept. Aware to provide a copy of the vaccination record if obtained from local pharmacy or Health Dept. Verbalized acceptance and understanding.  Flu Vaccine status: Due, Education has been provided regarding the importance of this vaccine. Advised may receive this vaccine  at local pharmacy or Health Dept. Aware to provide a copy of the vaccination record if obtained from local pharmacy or Health Dept. Verbalized acceptance and understanding.  Pneumococcal vaccine status: Up to date  Covid-19 vaccine status: Completed vaccines  Qualifies for Shingles Vaccine? Yes   Zostavax completed No   Shingrix Completed?: No.    Education has been provided regarding the importance of this vaccine. Patient has been advised to call insurance company to determine out of pocket expense if they have not yet received this vaccine. Advised may also receive vaccine at local pharmacy or Health Dept. Verbalized acceptance and understanding.  Screening Tests Health Maintenance  Topic Date Due   DTaP/Tdap/Td (1 - Tdap) Never done   INFLUENZA VACCINE  03/22/2023   COVID-19 Vaccine (3 - Moderna risk series) 04/11/2023 (Originally 12/29/2019)   Zoster Vaccines- Shingrix (1 of 2) 06/26/2023 (Originally 10/12/1959)   MAMMOGRAM  05/11/2023   Medicare Annual Wellness (AWV)  04/09/2024   Pneumonia Vaccine 89+ Years old  Completed   DEXA SCAN  Completed   HPV VACCINES  Aged Out    Health Maintenance  Health Maintenance Due  Topic Date Due   DTaP/Tdap/Td (1 - Tdap) Never done   INFLUENZA VACCINE  03/22/2023    Colorectal cancer screening: No longer required.   Mammogram status: No longer required due to Daugherty.  Bone Density status: Completed 03/26/2023. Results reflect: Bone density results: OSTEOPOROSIS. Repeat every 2 years.  Lung Cancer Screening: (Low Dose CT Chest recommended if Daugherty 28-80 years, 20 pack-year currently smoking OR have quit w/in 15years.) does not qualify.   Lung Cancer Screening Referral: n/a  Additional Screening:  Hepatitis C Screening: does not qualify;   Vision Screening:  Recommended annual ophthalmology exams for early detection of glaucoma and other disorders of the eye. Is the patient up to date with their annual eye exam?  Yes  Who is the provider  or what is the name of the office in which the patient attends annual eye exams? Dr.Davis  If pt is not established with a provider, would they like to be referred to a provider to establish care? No .   Dental Screening: Recommended annual dental exams for proper oral hygiene    Community Resource Referral / Chronic Care Management: CRR required this visit?  No   CCM required this visit?  No     Plan:     I have personally reviewed and noted the following in the patient's chart:   Medical and social history Use of alcohol, tobacco or illicit drugs  Current medications and supplements including opioid prescriptions. Patient is not currently taking opioid prescriptions. Functional ability and status Nutritional status Physical activity Advanced directives List of other physicians Hospitalizations, surgeries, and ER visits in previous 12 months Vitals Screenings to include cognitive, depression, and falls Referrals and appointments  In addition, I have reviewed and discussed with patient certain preventive protocols, quality metrics, and best practice recommendations. A written personalized care plan for preventive services as well as general preventive health recommendations were provided to patient.     Lorrene Reid, LPN   11/27/8117   After Visit Summary: (MyChart) Due to this being a telephonic visit, the after visit summary with patients personalized plan was offered to patient via MyChart   Nurse Notes: none

## 2023-04-20 ENCOUNTER — Other Ambulatory Visit: Payer: Self-pay | Admitting: Family Medicine

## 2023-04-20 DIAGNOSIS — M816 Localized osteoporosis [Lequesne]: Secondary | ICD-10-CM

## 2023-04-20 NOTE — Telephone Encounter (Signed)
Pt called stating that at her last visit, Dr Darlyn Read told her to double up on her Raloxifen Rx. Needs new Rx sent to pharmacy.

## 2023-04-23 NOTE — Telephone Encounter (Signed)
No, she does NOT need to take extra raloxifene. She needs to double up on the calcium. Continue raloxifene once a day only

## 2023-04-24 NOTE — Telephone Encounter (Signed)
lmtcb

## 2023-04-25 NOTE — Telephone Encounter (Signed)
PATIENT AWARE

## 2023-04-28 ENCOUNTER — Other Ambulatory Visit: Payer: Self-pay | Admitting: Family Medicine

## 2023-04-28 DIAGNOSIS — M816 Localized osteoporosis [Lequesne]: Secondary | ICD-10-CM

## 2023-04-30 ENCOUNTER — Other Ambulatory Visit: Payer: Self-pay | Admitting: Family Medicine

## 2023-04-30 DIAGNOSIS — Z1231 Encounter for screening mammogram for malignant neoplasm of breast: Secondary | ICD-10-CM

## 2023-05-16 ENCOUNTER — Ambulatory Visit
Admission: RE | Admit: 2023-05-16 | Discharge: 2023-05-16 | Disposition: A | Discharge status: Home / Self Care | Payer: Medicare Other | Source: Ambulatory Visit | Attending: Family Medicine | Admitting: Family Medicine

## 2023-05-16 DIAGNOSIS — Z1231 Encounter for screening mammogram for malignant neoplasm of breast: Secondary | ICD-10-CM | POA: Diagnosis not present

## 2023-05-19 ENCOUNTER — Other Ambulatory Visit: Payer: Self-pay | Admitting: Family Medicine

## 2023-05-19 DIAGNOSIS — M816 Localized osteoporosis [Lequesne]: Secondary | ICD-10-CM

## 2023-10-02 ENCOUNTER — Ambulatory Visit: Payer: Self-pay | Admitting: Family Medicine

## 2023-10-02 NOTE — Telephone Encounter (Signed)
Chief Complaint: Right ankle pain Symptoms: Swelling, redness, knot on top of foot, lack of appetite Frequency: Constant with weight-bearing Pertinent Negatives: Patient denies numbness Disposition: [] ED /[] Urgent Care (no appt availability in office) / [x] Appointment(In office/virtual)/ []  [facility]/ [] Home Care/ [] Refused Recommended Disposition /[] [place] [facility]/ []  Follow-up with PCP Additional Notes: Spoke with pt daughter, Albin Felling. Pt walking around with a walker. Pt unable to put weight on ankle. Not sure what caused this. Per pt, this has happened before. There is a knot on top of foot. Pt keeping ice on it and elevated in bed. Per daughter, really red and swollen. Pt daughter thinks she is taking Advil. Pt daughter states this has happened before. It will last for a few days and go away. Appt scheduled for tmrw. This RN educated pt daughter on home care, new-worsening symptoms, when to call back/seek emergent care. Pt daughter verbalized understanding and agrees to plan.    Copied from CRM [id number]. Topic: Clinical - Red Word Triage >> Oct 02, 2023 11:29 AM Elle L wrote: Red Word that prompted transfer to Nurse Triage: The patient's daughter, Kayson Bullis, states the patient's ankle is red and swollen and she cannot put weight on it. It started on Monday and the patient states that it has happened before. Reason for Disposition  [1] Redness of the skin AND [2] no fever  Answer Assessment - Initial Assessment Questions 1. ONSET: "When did the pain start?"      Yesterday 2. LOCATION: "Where is the pain located?"      Right ankle 3. PAIN: "How bad is the pain?"    (Scale 1-10; or mild, moderate, severe)  - MILD (1-3): doesn't interfere with normal activities.   - MODERATE (4-7): interferes with normal activities (e.g., work or school) or awakens from sleep, limping.   - SEVERE (8-10): excruciating pain, unable to do any normal activities, unable to walk.  5. CAUSE:  "What do you think is causing the ankle pain?"     Not sure 6. OTHER SYMPTOMS: "Do you have any other symptoms?" (e.g., calf pain, rash, fever, swelling)     Swelling  Protocols used: Ankle Pain-A-AH

## 2023-10-03 ENCOUNTER — Ambulatory Visit: Payer: Medicare Other | Admitting: Family Medicine

## 2023-10-29 ENCOUNTER — Ambulatory Visit: Payer: Self-pay | Admitting: Family Medicine

## 2023-10-29 NOTE — Telephone Encounter (Signed)
 Chief Complaint: right ankle swelling Symptoms: Swelling in right ankle Frequency: a month, intermittent Pertinent Negatives: Patient denies shortness of breath, difficulty breathing Disposition: [] ED /[] Urgent Care (no appt availability in office) / [x] Appointment(In office/virtual)/ []  [facility]/ [] Home Care/ [] Refused Recommended Disposition /[] [facility]/ []  Follow-up with PCP Additional Notes: Patient's daughter called in stating patient has had intermittent right ankle swelling for a month, stating it was worse at the beginning and very red. Patient refused to go to doctor and elevated it and it slowly got better, but is now worsening again. Patient is unsure of cause. Patient hasn't reported trouble breathing or chest pain. Appt made for further evaluation and education provided to daughter of s/s to look out for to seek emergency evaluation at ED.   Reason for Disposition  MILD or MODERATE ankle swelling (e.g., can't move joint normally, can't do usual activities) (Exceptions: Itchy, localized swelling; swelling is chronic.)  Answer Assessment - Initial Assessment Questions 1. LOCATION: "Which ankle is swollen?" "Where is the swelling?"     Right ankle - was down the foot  2. ONSET: "When did the swelling start?"     A month  3. SWELLING: "How bad is the swelling?" Or, "How large is it?" (e.g., mild, moderate, severe; size of localized swelling)    - NONE: No joint swelling.   - LOCALIZED: Localized; small area of puffy or swollen skin (e.g., insect bite, skin irritation).   - MILD: Joint looks or feels mildly swollen or puffy.   - MODERATE: Swollen; interferes with normal activities (e.g., work or school); decreased range of movement; may be limping.   - SEVERE: Very swollen; can't move swollen joint at all; limping a lot or unable to walk.     Moderate - affecting walking and movement 4. PAIN: "Is there any pain?" If Yes, ask: "How bad is it?" (Scale 1-10;  or mild, moderate, severe)   - NONE (0): no pain.   - MILD (1-3): doesn't interfere with normal activities.    - MODERATE (4-7): interferes with normal activities (e.g., work or school) or awakens from sleep, limping.    - SEVERE (8-10): excruciating pain, unable to do any normal activities, unable to walk.     Daughter is unsure  5. CAUSE: "What do you think caused the ankle swelling?"     Unsure 6. OTHER SYMPTOMS: "Do you have any other symptoms?" (e.g., fever, chest pain, difficulty breathing, calf pain)     No  Protocols used: Ankle Swelling-A-AH

## 2023-10-29 NOTE — Telephone Encounter (Signed)
 No answer, left message to return call for triage.   Message from [place] E sent at 10/29/2023  1:00 PM EDT  Summary: Swelling   Copied From CRM [id number]. Reason for Triage: Pt is experiencing swelling in her ankles  Best contact: [phone]

## 2023-10-29 NOTE — Telephone Encounter (Signed)
 Patient called, left VM to return the call to the office to speak to NT.    Summary: Swelling   Copied From CRM [id number]. Reason for Triage: Pt is experiencing swelling in her ankles  Best contact: [phone]

## 2023-10-30 ENCOUNTER — Encounter: Payer: Self-pay | Admitting: Family

## 2023-10-30 ENCOUNTER — Ambulatory Visit (INDEPENDENT_AMBULATORY_CARE_PROVIDER_SITE_OTHER): Admitting: Family

## 2023-10-30 ENCOUNTER — Ambulatory Visit (INDEPENDENT_AMBULATORY_CARE_PROVIDER_SITE_OTHER)

## 2023-10-30 VITALS — BP 165/75 | HR 88 | Temp 97.3°F | Ht <= 58 in | Wt 125.2 lb

## 2023-10-30 DIAGNOSIS — M25672 Stiffness of left ankle, not elsewhere classified: Secondary | ICD-10-CM

## 2023-10-30 DIAGNOSIS — R413 Other amnesia: Secondary | ICD-10-CM

## 2023-10-30 DIAGNOSIS — M7732 Calcaneal spur, left foot: Secondary | ICD-10-CM | POA: Diagnosis not present

## 2023-10-30 DIAGNOSIS — M25472 Effusion, left ankle: Secondary | ICD-10-CM

## 2023-10-30 MED ORDER — DONEPEZIL HCL 5 MG PO TABS: 5.0000 mg | ORAL_TABLET | Freq: Every day | ORAL | 2 refills | Status: DC

## 2023-10-30 MED ORDER — MEMANTINE HCL 5 MG PO TABS: 5.0000 mg | ORAL_TABLET | Freq: Two times a day (BID) | ORAL | 1 refills | Status: DC

## 2023-10-30 NOTE — Progress Notes (Signed)
 Subjective:    Patient ID: [name], female    DOB: 24-Aug-1940, 83 y.o.   MRN: [id number]  Chief Complaint  Patient presents with   Joint Swelling    Left ankle swelling no injury, it caused her to fall    Memory Loss    HPI PT presents to the office today with left ankle swelling that started two weeks. Reports it comes and goes. Denies any pain, but can not flex her left ankle. Denies any injury, redness, or warmth. It has "given away" and fell.   She reports she has memory issues over the last few months. Worried this is going to become worse.    Review of Systems  All other systems reviewed and are negative.   Social History   Socioeconomic History   Marital status: Married    Spouse name: Not on file   Number of children: Not on file   Years of education: Not on file   Highest education level: Not on file  Occupational History   Not on file  Tobacco Use   Smoking status: Never   Smokeless tobacco: Never  Vaping Use   Vaping status: Never Used  Substance and Sexual Activity   Alcohol use: No   Drug use: No   Sexual activity: Never  Other Topics Concern   Not on file  Social History Narrative   Lives with husband and dog.    Social Drivers of Corporate investment banker Strain: Low Risk  (04/10/2023)   Overall Financial Resource Strain (CARDIA)    Difficulty of Paying Living Expenses: Not hard at all  Food Insecurity: No Food Insecurity (04/10/2023)   Hunger Vital Sign    Worried About Running Out of Food in the Last Year: Never true    Ran Out of Food in the Last Year: Never true  Transportation Needs: No Transportation Needs (04/10/2023)   PRAPARE - Administrator, Civil Service (Medical): No    Lack of Transportation (Non-Medical): No  Physical Activity: Insufficiently Active (04/10/2023)   Exercise Vital Sign    Days of Exercise per Week: 3 days    Minutes of Exercise per Session: 30 min  Stress: No Stress Concern Present (04/10/2023)    [employer] of Occupational Health - Occupational Stress Questionnaire    Feeling of Stress : Not at all  Social Connections: Socially Integrated (04/10/2023)   Social Connection and Isolation Panel [NHANES]    Frequency of Communication with Friends and Family: More than three times a week    Frequency of Social Gatherings with Friends and Family: More than three times a week    Attends Religious Services: More than 4 times per year    Active Member of [employer] or Organizations: Yes    Attends Engineer, structural: More than 4 times per year    Marital Status: Married   Family History  Problem Relation Age of Onset   Heart disease Mother        No details.  Died age 9.   COPD Father    Breast cancer Neg Hx         Objective:   Physical Exam Vitals reviewed.  Constitutional:      General: She is not in acute distress.    Appearance: She is well-developed.  HENT:     Head: Normocephalic and atraumatic.     Right Ear: Tympanic membrane normal.     Left Ear: Tympanic membrane normal.  Eyes:     Pupils: Pupils are equal, round, and reactive to light.  Neck:     Thyroid: No thyromegaly.  Cardiovascular:     Rate and Rhythm: Normal rate and regular rhythm.     Heart sounds: Murmur heard.  Pulmonary:     Effort: Pulmonary effort is normal. No respiratory distress.     Breath sounds: Normal breath sounds. No wheezing.  Abdominal:     General: Bowel sounds are normal. There is no distension.     Palpations: Abdomen is soft.     Tenderness: There is no abdominal tenderness.  Musculoskeletal:        General: Swelling present. No tenderness.     Cervical back: Normal range of motion and neck supple.     Comments: Left ankle swelling, unable to flex foot.   Skin:    General: Skin is warm and dry.  Neurological:     Mental Status: She is alert and oriented to person, place, and time.     Cranial Nerves: No cranial nerve deficit.     Deep Tendon Reflexes:  Reflexes are normal and symmetric.  Psychiatric:        Behavior: Behavior normal.        Thought Content: Thought content normal.        Judgment: Judgment normal.       BP (!) 165/75   Pulse 88   Temp (!) 97.3 F (36.3 C) (Temporal)   Ht 4\' 9"  (1.448 m)   Wt 125 lb 3.2 oz (56.8 kg)   SpO2 96%   BMI 27.09 kg/m      Assessment & Plan:  [name] comes in today with chief complaint of Joint Swelling (Left ankle swelling no injury, it caused her to fall ) and Memory Loss   Diagnosis and orders addressed:  1. Left ankle swelling (Primary) - DG Ankle Complete Left; Future  2. Memory loss Start Namenda and Aricept  Memory strategies discussed  Follow up with PCP in 1-2 months to adjust medications  - memantine (NAMENDA) 5 MG tablet; Take 1 tablet (5 mg total) by mouth 2 (two) times daily.  Dispense: 180 tablet; Refill: 1 - donepezil (ARICEPT) 5 MG tablet; Take 1 tablet (5 mg total) by mouth at bedtime.  Dispense: 90 tablet; Refill: 2  3. Decreased range of motion of left ankle   X-ray of ankle pending Rest Ice Compression  Elevation Does not look like gout, infection given no redness, warmth, or pain.   Jannifer Rodney, FNP

## 2023-10-30 NOTE — Patient Instructions (Signed)

## 2023-11-05 ENCOUNTER — Ambulatory Visit: Payer: Self-pay | Admitting: Family Medicine

## 2023-11-05 NOTE — Telephone Encounter (Signed)
 Copied from CRM [id number]. Topic: Clinical - Lab/Test Results >> Nov 05, 2023 10:43 AM Martha Clan wrote: Reason for CRM: Patient requesting information on recent xray.   Chief Complaint: Imaging Results-PCP Call   Disposition: [] ED /[] Urgent Care (no appt availability in office) / [] Appointment(In office/virtual)/ []  [facility]/ [] Home Care/ [] Refused Recommended Disposition /[] [facility]/ [x]  Follow-up with PCP   Additional Notes: SW is calling in regarding results of a recent XR of her ankle. The patient denies any changes in her symptoms. Verified with Misty Stanley on the CAL at [facility] that results are not available at this time. Informed the patient of this, verbalized understanding and requested when results are released to call the patient.

## 2023-11-27 ENCOUNTER — Ambulatory Visit (INDEPENDENT_AMBULATORY_CARE_PROVIDER_SITE_OTHER): Admitting: Family Medicine

## 2023-11-27 ENCOUNTER — Encounter: Payer: Self-pay | Admitting: Family Medicine

## 2023-11-27 VITALS — BP 138/81 | HR 88 | Temp 98.5°F | Ht <= 58 in | Wt 119.0 lb

## 2023-11-27 DIAGNOSIS — G5752 Tarsal tunnel syndrome, left lower limb: Secondary | ICD-10-CM

## 2023-11-27 NOTE — Progress Notes (Signed)
 Subjective:  Patient ID: [name], female    DOB: 07-18-1941  Age: 83 y.o. MRN: [id number]  CC: Numbness (A left foot numbness. Gradually getting better. Can't point toes up or back. Having to walking flat footed. No pain. No edema anymore. ) and Hand tremors (Hand tremors. No affecting grasp. On and off all day. For 1 month. )   HPI [name] presents for numbness in left foot can't dorsiflex toes.  This has been going on for about a month.  She says it is getting a little better.  This is because the toes can actually be dorsiflexed but the ankle cannot.  Sensation has been diminished.  She denies any other focal neurologic deficit.  She can walk although it is flat-footed on the left.     11/27/2023   10:40 AM 04/10/2023    1:08 PM 03/26/2023    9:07 AM  Depression screen PHQ 2/9  Decreased Interest 2 0 0  Down, Depressed, Hopeless 2 0 0  PHQ - 2 Score 4 0 0  Altered sleeping 2    Tired, decreased energy 2    Change in appetite 1    Feeling bad or failure about yourself  0    Trouble concentrating 0    Moving slowly or fidgety/restless 0    Suicidal thoughts 0    PHQ-9 Score 9    Difficult doing work/chores Not difficult at all      History [name] has a past medical history of Allergy, Cataract, Localized osteoporosis without current pathological fracture (12/29/2016), and Murmur (05/18/2020).   She has a past surgical history that includes Cataract extraction, bilateral (2009); Urethral sling (2009); Cesarean section; Uterine fibroid surgery; and Breast cyst excision (Right).   Her family history includes COPD in her father; Heart disease in her mother.She reports that she has never smoked. She has never used smokeless tobacco. She reports that she does not drink alcohol and does not use drugs.    ROS Review of Systems  Constitutional: Negative.   HENT: Negative.    Eyes:  Negative for visual disturbance.  Respiratory:  Negative for shortness of breath.    Cardiovascular:  Negative for chest pain.  Gastrointestinal:  Negative for abdominal pain.  Musculoskeletal:  Negative for arthralgias.    Objective:  BP 138/81   Pulse 88   Temp 98.5 F (36.9 C)   Ht 4\' 9"  (1.448 m)   Wt 119 lb (54 kg)   SpO2 92%   BMI 25.75 kg/m   BP Readings from Last 3 Encounters:  11/27/23 138/81  10/30/23 (!) 165/75  03/26/23 (!) 147/78    Wt Readings from Last 3 Encounters:  11/27/23 119 lb (54 kg)  10/30/23 125 lb 3.2 oz (56.8 kg)  04/10/23 135 lb (61.2 kg)     Physical Exam Constitutional:      General: She is not in acute distress.    Appearance: She is well-developed.  Cardiovascular:     Rate and Rhythm: Normal rate and regular rhythm.  Pulmonary:     Breath sounds: Normal breath sounds.  Musculoskeletal:        General: No tenderness.     Comments: Although there is there is no foot drop she has to walk flat-footed with the left foot.  This is because she cannot dorsiflexThe foot at the ankle.  She is able to dorsiflex the toes on observation however..  Skin:    General: Skin is warm and dry.  Neurological:     Mental Status: She is alert and oriented to person, place, and time.      Assessment & Plan:  Tarsal tunnel syndrome of left side  35 minutes was spent with the patient most of which was explaining the possibilities for treatment and prognosis for tarsal tunnel.  We are going to observe it for a few more weeks since she has noted improvement.Since there are no further neurologic changes I do not believe scanning is going to be helpful at this time.   Follow-up: Return in about 1 month (around 12/27/2023).  Mechele Claude, M.D.

## 2023-12-27 ENCOUNTER — Encounter: Payer: Self-pay | Admitting: Family Medicine

## 2023-12-27 ENCOUNTER — Ambulatory Visit (INDEPENDENT_AMBULATORY_CARE_PROVIDER_SITE_OTHER): Admitting: Family Medicine

## 2023-12-27 VITALS — BP 155/87 | Temp 98.0°F | Ht <= 58 in | Wt 120.0 lb

## 2023-12-27 DIAGNOSIS — G5752 Tarsal tunnel syndrome, left lower limb: Secondary | ICD-10-CM | POA: Diagnosis not present

## 2023-12-27 DIAGNOSIS — M25472 Effusion, left ankle: Secondary | ICD-10-CM | POA: Diagnosis not present

## 2023-12-27 NOTE — Progress Notes (Signed)
   Subjective:  Patient ID: [name], female    DOB: 08-17-1941  Age: 83 y.o. MRN: [id number]  CC: Foot Swelling (Ankle swelling going down. Calf to toes and causes and numbness. Getting better but still occurring. Left leg. )   HPI [name] presents for swelling is better. ROM good, but can't dorsiflex. Numbness improving.Feet still tingling.     11/27/2023   10:40 AM 04/10/2023    1:08 PM 03/26/2023    9:07 AM  Depression screen PHQ 2/9  Decreased Interest 2 0 0  Down, Depressed, Hopeless 2 0 0  PHQ - 2 Score 4 0 0  Altered sleeping 2    Tired, decreased energy 2    Change in appetite 1    Feeling bad or failure about yourself  0    Trouble concentrating 0    Moving slowly or fidgety/restless 0    Suicidal thoughts 0    PHQ-9 Score 9    Difficult doing work/chores Not difficult at all      History [name] has a past medical history of Allergy, Cataract, Localized osteoporosis without current pathological fracture (12/29/2016), and Murmur (05/18/2020).   She has a past surgical history that includes Cataract extraction, bilateral (2009); Urethral sling (2009); Cesarean section; Uterine fibroid surgery; and Breast cyst excision (Right).   Her family history includes COPD in her father; Heart disease in her mother.She reports that she has never smoked. She has never used smokeless tobacco. She reports that she does not drink alcohol and does not use drugs.    ROS Review of Systems  Constitutional: Negative.   HENT: Negative.    Eyes:  Negative for visual disturbance.  Respiratory:  Negative for shortness of breath.   Cardiovascular:  Positive for leg swelling (much better.). Negative for chest pain.  Gastrointestinal:  Negative for abdominal pain.  Musculoskeletal:  Negative for arthralgias.  Neurological:  Positive for numbness (in feet).    Objective:  BP (!) 155/87   Temp 98 F (36.7 C)   Ht 4\' 9"  (1.448 m)   Wt 120 lb (54.4 kg)   SpO2 95%   BMI 25.97  kg/m   BP Readings from Last 3 Encounters:  12/27/23 (!) 155/87  11/27/23 138/81  10/30/23 (!) 165/75    Wt Readings from Last 3 Encounters:  12/27/23 120 lb (54.4 kg)  11/27/23 119 lb (54 kg)  10/30/23 125 lb 3.2 oz (56.8 kg)     Physical Exam Constitutional:      General: She is not in acute distress.    Appearance: She is well-developed.  Cardiovascular:     Rate and Rhythm: Normal rate and regular rhythm.  Pulmonary:     Breath sounds: Normal breath sounds.  Musculoskeletal:        General: Normal range of motion.  Skin:    General: Skin is warm and dry.  Neurological:     Mental Status: She is alert and oriented to person, place, and time.      Assessment & Plan:  Tarsal tunnel syndrome of left side  Left ankle swelling     Follow-up: No follow-ups on file.  [name], M.D.

## 2023-12-31 ENCOUNTER — Encounter: Payer: Self-pay | Admitting: Family Medicine

## 2023-12-31 ENCOUNTER — Encounter: Payer: Self-pay | Admitting: Cardiology

## 2023-12-31 DIAGNOSIS — I10 Essential (primary) hypertension: Secondary | ICD-10-CM | POA: Insufficient documentation

## 2023-12-31 DIAGNOSIS — I350 Nonrheumatic aortic (valve) stenosis: Secondary | ICD-10-CM | POA: Insufficient documentation

## 2023-12-31 HISTORY — DX: Essential (primary) hypertension: I10

## 2023-12-31 NOTE — Progress Notes (Deleted)
 Cardiology Office Note   Date:  12/31/2023   ID:  [name], [name] 03/17/1941, MRN [id number]  PCP:  Roise Cleaver, MD  Cardiologist:   None Referring:  ***  No chief complaint on file.     History of Present Illness: [name] is a 83 y.o. female who presents for ***    I saw her in 2021.   She had a murmur.  On echo there was mild to moderate AS.  ***    I    Past Medical History:  Diagnosis Date   Allergy    seasonal   Cataract    Localized osteoporosis without current pathological fracture 12/29/2016   Murmur 05/18/2020    Past Surgical History:  Procedure Laterality Date   BREAST CYST EXCISION Right    CATARACT EXTRACTION, BILATERAL  2009   CESAREAN SECTION     URETHRAL SLING  2009   UTERINE FIBROID SURGERY       Current Outpatient Medications  Medication Sig Dispense Refill   calcium carbonate (OS-CAL - DOSED IN MG OF ELEMENTAL CALCIUM) 1250 (500 Ca) MG tablet Take 1 tablet by mouth.     donepezil  (ARICEPT ) 5 MG tablet Take 1 tablet (5 mg total) by mouth at bedtime. 90 tablet 2   memantine  (NAMENDA ) 5 MG tablet Take 1 tablet (5 mg total) by mouth 2 (two) times daily. 180 tablet 1   raloxifene  (EVISTA ) 60 MG tablet Take 1 tablet (60 mg total) by mouth daily. 90 tablet 3   No current facility-administered medications for this visit.    Allergies:   Patient has no known allergies.    Social History:  The patient  reports that she has never smoked. She has never used smokeless tobacco. She reports that she does not drink alcohol and does not use drugs.   Family History:  The patient's ***family history includes COPD in her father; Heart disease in her mother.    ROS:  Please see the history of present illness.   Otherwise, review of systems are positive for {NONE DEFAULTED:18576}.   All other systems are reviewed and negative.    PHYSICAL EXAM: VS:  There were no vitals taken for this visit. , BMI There is no height or weight on file to  calculate BMI. GENERAL:  Well appearing HEENT:  Pupils equal round and reactive, fundi not visualized, oral mucosa unremarkable NECK:  No jugular venous distention, waveform within normal limits, carotid upstroke brisk and symmetric, no bruits, no thyromegaly LYMPHATICS:  No cervical, inguinal adenopathy LUNGS:  Clear to auscultation bilaterally BACK:  No CVA tenderness CHEST:  Unremarkable HEART:  PMI not displaced or sustained,S1 and S2 within normal limits, no S3, no S4, no clicks, no rubs, *** murmurs ABD:  Flat, positive bowel sounds normal in frequency in pitch, no bruits, no rebound, no guarding, no midline pulsatile mass, no hepatomegaly, no splenomegaly EXT:  2 plus pulses throughout, no edema, no cyanosis no clubbing SKIN:  No rashes no nodules NEURO:  Cranial nerves II through XII grossly intact, motor grossly intact throughout PSYCH:  Cognitively intact, oriented to person place and time    EKG:        Recent Labs: No results found for requested labs within last 365 days.    Lipid Panel    Component Value Date/Time   CHOL 228 (H) 03/22/2022 0953   TRIG 107 03/22/2022 0953   HDL 71 03/22/2022 0953   CHOLHDL 3.2 03/22/2022 4034  LDLCALC 138 (H) 03/22/2022 0953      Wt Readings from Last 3 Encounters:  12/27/23 120 lb (54.4 kg)  11/27/23 119 lb (54 kg)  10/30/23 125 lb 3.2 oz (56.8 kg)      Other studies Reviewed: Additional studies/ records that were reviewed today include: ***. Review of the above records demonstrates:  Please see elsewhere in the note.  ***   ASSESSMENT AND PLAN:  Aortic stenosis:  ***  HTN:  ***    Current medicines are reviewed at length with the patient today.  The patient {ACTIONS; HAS/DOES NOT HAVE:19233} concerns regarding medicines.  The following changes have been made:  {PLAN; NO CHANGE:13088:s}  Labs/ tests ordered today include: *** No orders of the defined types were placed in this encounter.    Disposition:    FU with ***    Signed, Eilleen Grates, MD  12/31/2023 9:23 PM    [facility]

## 2024-01-01 DIAGNOSIS — M79675 Pain in left toe(s): Secondary | ICD-10-CM | POA: Diagnosis not present

## 2024-01-01 DIAGNOSIS — B351 Tinea unguium: Secondary | ICD-10-CM | POA: Diagnosis not present

## 2024-01-01 DIAGNOSIS — M79674 Pain in right toe(s): Secondary | ICD-10-CM | POA: Diagnosis not present

## 2024-01-02 ENCOUNTER — Ambulatory Visit: Admitting: Cardiology

## 2024-01-02 DIAGNOSIS — I10 Essential (primary) hypertension: Secondary | ICD-10-CM

## 2024-01-02 DIAGNOSIS — I350 Nonrheumatic aortic (valve) stenosis: Secondary | ICD-10-CM

## 2024-03-24 NOTE — Progress Notes (Unsigned)
 Cardiology Office Note   Date:  03/26/2024   ID:  Jalon, Blackwelder 11-Jan-1941, MRN [id number]  PCP:  Zollie Lowers, MD  Cardiologist:   Lynwood Schilling, MD Referring:  Zollie Lowers, MD  No chief complaint on file.     History of Present Illness: [name] is a 83 y.o. female who I saw previously for evaluation of a murmur.  She had some mild to moderate AS on echo in October 2021.  I have not seen her since then.  She was supposed to have 1 year follow-up but somehow this did not happen.  She is actually done quite well.  She lives independently.  She does her household chores. The patient denies any new symptoms such as chest discomfort, neck or arm discomfort. There has been no new shortness of breath, PND or orthopnea. There have been no reported palpitations, presyncope or syncope.  She can sleep and mop and walk to the mailbox without complaints.  Past Medical History:  Diagnosis Date   Allergy    seasonal   Cataract    Hypertension 12/31/2023   Localized osteoporosis without current pathological fracture 12/29/2016   Murmur 05/18/2020    Past Surgical History:  Procedure Laterality Date   BREAST CYST EXCISION Right    CATARACT EXTRACTION, BILATERAL  2009   CESAREAN SECTION     URETHRAL SLING  2009   UTERINE FIBROID SURGERY       Current Outpatient Medications  Medication Sig Dispense Refill   calcium carbonate (OS-CAL - DOSED IN MG OF ELEMENTAL CALCIUM) 1250 (500 Ca) MG tablet Take 1 tablet by mouth.     donepezil  (ARICEPT ) 5 MG tablet Take 1 tablet (5 mg total) by mouth at bedtime. 90 tablet 2   memantine  (NAMENDA ) 5 MG tablet Take 1 tablet (5 mg total) by mouth 2 (two) times daily. 180 tablet 3   raloxifene  (EVISTA ) 60 MG tablet Take 1 tablet (60 mg total) by mouth daily. 90 tablet 3   No current facility-administered medications for this visit.    Allergies:   Patient has no known allergies.    Social History:  The patient  reports that she has  never smoked. She has never used smokeless tobacco. She reports that she does not drink alcohol and does not use drugs.   Family History:  The patient's family history includes COPD in her father; Heart disease in her mother.    ROS:  Please see the history of present illness.   Otherwise, review of systems are positive for none.   All other systems are reviewed and negative.    PHYSICAL EXAM: VS:  BP (!) 140/85   Pulse 68   Ht 4' 9 (1.448 m)   Wt 122 lb (55.3 kg)   BMI 26.40 kg/m  , BMI Body mass index is 26.4 kg/m. GENERAL:  Well appearing HEENT:  Pupils equal round and reactive, fundi not visualized, oral mucosa unremarkable NECK:  No jugular venous distention, waveform within normal limits, carotid upstroke brisk and symmetric, no bruits, no thyromegaly LYMPHATICS:  No cervical, inguinal adenopathy LUNGS:  Clear to auscultation bilaterally BACK:  No CVA tenderness CHEST:  Unremarkable HEART:  PMI not displaced or sustained,S1 and S2 within normal limits, no S3, no S4, no clicks, no rubs, 3 out of 6 mid to late peaking systolic murmur radiating at the aortic outflow tract without change in Valsalva, no diastolic murmurs ABD:  Flat, positive bowel sounds normal in frequency  in pitch, no bruits, no rebound, no guarding, no midline pulsatile mass, no hepatomegaly, no splenomegaly EXT:  2 plus pulses throughout, no edema, no cyanosis no clubbing SKIN:  No rashes no nodules NEURO:  Cranial nerves II through XII grossly intact, motor grossly intact throughout [facility]:  Cognitively intact, oriented to person place and time    EKG:  EKG Interpretation Date/Time:  Wednesday March 26 2024 14:08:39 EDT Ventricular Rate:  68 PR Interval:  102 QRS Duration:  86 QT Interval:  388 QTC Calculation: 412 R Axis:   19  Text Interpretation: Sinus rhythm with short PR When compared with ECG of 23-Sep-2007 08:23, No significant change was found Confirmed by Lavona Agent ([id number]) on 03/26/2024  2:34:14 PM     Recent Labs: No results found for requested labs within last 365 days.    Lipid Panel    Component Value Date/Time   CHOL 228 (H) 03/22/2022 0953   TRIG 107 03/22/2022 0953   HDL 71 03/22/2022 0953   CHOLHDL 3.2 03/22/2022 0953   LDLCALC 138 (H) 03/22/2022 0953      Wt Readings from Last 3 Encounters:  03/26/24 122 lb (55.3 kg)  03/26/24 121 lb 9.6 oz (55.2 kg)  12/27/23 120 lb (54.4 kg)      Other studies Reviewed: Additional studies/ records that were reviewed today include previous echo. Review of the above records demonstrates:  Please see elsewhere in the note.    ASSESSMENT AND PLAN:  AS:   She has no symptoms.  However, admittedly she is slow down in her activities because of her age.  I will follow-up with an echocardiogram and further management will be based on this reading.   HTN: Her blood pressure is elevated slightly and she is gena keep an eye on this.  No change in therapy.   Current medicines are reviewed at length with the patient today.  The patient does not have concerns regarding medicines.  The following changes have been made:  no change  Labs/ tests ordered today include:   Orders Placed This Encounter  Procedures   EKG 12-Lead     Disposition:   FU with me in 12 months.     Signed, Agent Lavona, MD  03/26/2024 2:38 PM    [facility]

## 2024-03-26 ENCOUNTER — Ambulatory Visit: Admitting: Cardiology

## 2024-03-26 ENCOUNTER — Encounter: Payer: Self-pay | Admitting: Cardiology

## 2024-03-26 ENCOUNTER — Ambulatory Visit: Payer: Medicare Other | Admitting: Family Medicine

## 2024-03-26 ENCOUNTER — Encounter: Payer: Self-pay | Admitting: Family Medicine

## 2024-03-26 VITALS — BP 140/85 | HR 68 | Ht <= 58 in | Wt 122.0 lb

## 2024-03-26 VITALS — BP 154/75 | HR 70 | Temp 97.8°F | Ht <= 58 in | Wt 121.6 lb

## 2024-03-26 DIAGNOSIS — Z0001 Encounter for general adult medical examination with abnormal findings: Secondary | ICD-10-CM

## 2024-03-26 DIAGNOSIS — Z0000 Encounter for general adult medical examination without abnormal findings: Secondary | ICD-10-CM | POA: Diagnosis not present

## 2024-03-26 DIAGNOSIS — Z13220 Encounter for screening for lipoid disorders: Secondary | ICD-10-CM | POA: Diagnosis not present

## 2024-03-26 DIAGNOSIS — I10 Essential (primary) hypertension: Secondary | ICD-10-CM

## 2024-03-26 DIAGNOSIS — M816 Localized osteoporosis [Lequesne]: Secondary | ICD-10-CM

## 2024-03-26 DIAGNOSIS — R413 Other amnesia: Secondary | ICD-10-CM

## 2024-03-26 DIAGNOSIS — E559 Vitamin D deficiency, unspecified: Secondary | ICD-10-CM

## 2024-03-26 DIAGNOSIS — I350 Nonrheumatic aortic (valve) stenosis: Secondary | ICD-10-CM | POA: Diagnosis not present

## 2024-03-26 LAB — URINALYSIS
Bilirubin, UA: NEGATIVE
Glucose, UA: NEGATIVE
Nitrite, UA: NEGATIVE
Protein,UA: NEGATIVE
Specific Gravity, UA: 1.02 (ref 1.005–1.030)
Urobilinogen, Ur: 1 mg/dL (ref 0.2–1.0)
pH, UA: 7 (ref 5.0–7.5)

## 2024-03-26 LAB — LIPID PANEL

## 2024-03-26 MED ORDER — MEMANTINE HCL 5 MG PO TABS: 5.0000 mg | ORAL_TABLET | Freq: Two times a day (BID) | ORAL | 3 refills | Status: AC

## 2024-03-26 MED ORDER — RALOXIFENE HCL 60 MG PO TABS: 60.0000 mg | ORAL_TABLET | Freq: Every day | ORAL | 3 refills | Status: AC

## 2024-03-26 NOTE — Progress Notes (Signed)
 Subjective:  Patient ID: [name], female    DOB: 06-Nov-1940  Age: 83 y.o. MRN: [id number]  CC: Annual Exam   HPI ANDRA HESLIN presents for CPE     03/26/2024    9:16 AM 11/27/2023   10:40 AM 04/10/2023    1:08 PM  Depression screen PHQ 2/9  Decreased Interest 0 2 0  Down, Depressed, Hopeless 0 2 0  PHQ - 2 Score 0 4 0  Altered sleeping  2   Tired, decreased energy  2   Change in appetite  1   Feeling bad or failure about yourself   0   Trouble concentrating  0   Moving slowly or fidgety/restless  0   Suicidal thoughts  0   PHQ-9 Score  9   Difficult doing work/chores  Not difficult at all     History Dakisha has a past medical history of Allergy, Cataract, Hypertension (12/31/2023), Localized osteoporosis without current pathological fracture (12/29/2016), and Murmur (05/18/2020).   She has a past surgical history that includes Cataract extraction, bilateral (2009); Urethral sling (2009); Cesarean section; Uterine fibroid surgery; and Breast cyst excision (Right).   Her family history includes COPD in her father; Heart disease in her mother.She reports that she has never smoked. She has never used smokeless tobacco. She reports that she does not drink alcohol and does not use drugs.    ROS Review of Systems  Constitutional:  Negative for appetite change, chills, diaphoresis, fatigue, fever and unexpected weight change.  HENT:  Negative for congestion, ear pain, hearing loss, postnasal drip, rhinorrhea, sneezing, sore throat and trouble swallowing.   Eyes:  Negative for pain.  Respiratory:  Negative for cough, chest tightness and shortness of breath.   Cardiovascular:  Negative for chest pain and palpitations.  Gastrointestinal:  Negative for abdominal pain, constipation, diarrhea, nausea and vomiting.  Endocrine: Negative for cold intolerance, heat intolerance, polydipsia, polyphagia and polyuria.  Genitourinary:  Negative for dysuria, frequency and menstrual problem.   Musculoskeletal:  Negative for arthralgias and joint swelling.  Skin:  Negative for rash.  Allergic/Immunologic: Negative for environmental allergies.  Neurological:  Positive for numbness (left foot). Negative for dizziness, weakness and headaches.  Psychiatric/Behavioral:  Negative for agitation and dysphoric mood.     Objective:  BP (!) 171/85   Pulse 70   Temp 97.8 F (36.6 C)   Ht 4' 9 (1.448 m)   Wt 121 lb 9.6 oz (55.2 kg)   SpO2 95%   BMI 26.31 kg/m   BP Readings from Last 3 Encounters:  03/26/24 (!) 171/85  12/27/23 (!) 155/87  11/27/23 138/81    Wt Readings from Last 3 Encounters:  03/26/24 121 lb 9.6 oz (55.2 kg)  12/27/23 120 lb (54.4 kg)  11/27/23 119 lb (54 kg)     Physical Exam Constitutional:      General: She is not in acute distress.    Appearance: She is well-developed.  HENT:     Head: Normocephalic and atraumatic.  Eyes:     Conjunctiva/sclera: Conjunctivae normal.     Pupils: Pupils are equal, round, and reactive to light.  Neck:     Thyroid: No thyromegaly.  Cardiovascular:     Rate and Rhythm: Normal rate and regular rhythm.     Heart sounds: Normal heart sounds. No murmur heard. Pulmonary:     Effort: Pulmonary effort is normal. No respiratory distress.     Breath sounds: Normal breath sounds. No wheezing or rales.  Abdominal:     General: Bowel sounds are normal. There is no distension.     Palpations: Abdomen is soft.     Tenderness: There is no abdominal tenderness.  Musculoskeletal:        General: Normal range of motion.     Cervical back: Normal range of motion and neck supple.  Lymphadenopathy:     Cervical: No cervical adenopathy.  Skin:    General: Skin is warm and dry.  Neurological:     Mental Status: She is alert and oriented to person, place, and time.  Psychiatric:        Behavior: Behavior normal.        Thought Content: Thought content normal.        Judgment: Judgment normal.      Assessment & Plan:   Annual physical exam -     CBC with Differential/Platelet -     CMP14+EGFR -     VITAMIN D  25 Hydroxy (Vit-D Deficiency, Fractures) -     Urinalysis -     Lipid panel  Vitamin D  deficiency -     VITAMIN D  25 Hydroxy (Vit-D Deficiency, Fractures)  Hypertension, unspecified type -     CMP14+EGFR -     Urinalysis  Lipid screening -     Lipid panel  Memory loss -     Memantine  HCl; Take 1 tablet (5 mg total) by mouth 2 (two) times daily.  Dispense: 180 tablet; Refill: 3  Localized osteoporosis without current pathological fracture -     Raloxifene  HCl; Take 1 tablet (60 mg total) by mouth daily.  Dispense: 90 tablet; Refill: 3     Follow-up: Return in about 6 months (around 09/26/2024).  Butler Der, M.D.

## 2024-03-26 NOTE — Patient Instructions (Signed)
 Use a quad cane for walking, always. A walker would be even better.

## 2024-03-26 NOTE — Patient Instructions (Addendum)
 Medication Instructions:  Your physician recommends that you continue on your current medications as directed. Please refer to the Current Medication list given to you today.  Labwork: none  Testing/Procedures: Your physician has requested that you have an echocardiogram. Echocardiography is a painless test that uses sound waves to create images of your heart. It provides your doctor with information about the size and shape of your heart and how well your heart's chambers and valves are working. This procedure takes approximately one hour. There are no restrictions for this procedure. Please do NOT wear cologne, perfume, aftershave, or lotions (deodorant is allowed). Please arrive 15 minutes prior to your appointment time.  Please note: We ask at that you not bring children with you during ultrasound (echo/ vascular) testing. Due to room size and safety concerns, children are not allowed in the ultrasound rooms during exams. Our front office staff cannot provide observation of children in our lobby area while testing is being conducted. An adult accompanying a patient to their appointment will only be allowed in the ultrasound room at the discretion of the ultrasound technician under special circumstances. We apologize for any inconvenience.  Follow-Up: Your physician recommends that you schedule a follow-up appointment in: 1 year. You will receive a reminder call in about 8-10 months reminding you to schedule your appointment. If you don't receive this call, please contact our office.  Any Other Special Instructions Will Be Listed Below (If Applicable).  If you need a refill on your cardiac medications before your next appointment, please call your pharmacy.

## 2024-03-27 LAB — CMP14+EGFR
ALT: 15 IU/L (ref 0–32)
AST: 30 IU/L (ref 0–40)
Albumin: 4.1 g/dL (ref 3.7–4.7)
Alkaline Phosphatase: 93 IU/L (ref 44–121)
BUN/Creatinine Ratio: 14 (ref 12–28)
BUN: 11 mg/dL (ref 8–27)
Bilirubin Total: 1.7 mg/dL — AB (ref 0.0–1.2)
CO2: 21 mmol/L (ref 20–29)
Calcium: 10.2 mg/dL (ref 8.7–10.3)
Chloride: 105 mmol/L (ref 96–106)
Creatinine, Ser: 0.79 mg/dL (ref 0.57–1.00)
Globulin, Total: 2.6 g/dL (ref 1.5–4.5)
Glucose: 88 mg/dL (ref 70–99)
Potassium: 4.1 mmol/L (ref 3.5–5.2)
Sodium: 147 mmol/L — AB (ref 134–144)
Total Protein: 6.7 g/dL (ref 6.0–8.5)
eGFR: 73 mL/min/1.73 (ref >59)

## 2024-03-27 LAB — CBC WITH DIFFERENTIAL/PLATELET
Basophils Absolute: 0.1 x10E3/uL (ref 0.0–0.2)
Basos: 1 %
EOS (ABSOLUTE): 0.2 x10E3/uL (ref 0.0–0.4)
Eos: 3 %
Hematocrit: 45.2 % (ref 34.0–46.6)
Hemoglobin: 14.9 g/dL (ref 11.1–15.9)
Immature Grans (Abs): 0 x10E3/uL (ref 0.0–0.1)
Immature Granulocytes: 0 %
Lymphocytes Absolute: 1.9 x10E3/uL (ref 0.7–3.1)
Lymphs: 28 %
MCH: 30 pg (ref 26.6–33.0)
MCHC: 33 g/dL (ref 31.5–35.7)
MCV: 91 fL (ref 79–97)
Monocytes Absolute: 0.6 x10E3/uL (ref 0.1–0.9)
Monocytes: 8 %
Neutrophils Absolute: 4.1 x10E3/uL (ref 1.4–7.0)
Neutrophils: 60 %
Platelets: 336 x10E3/uL (ref 150–450)
RBC: 4.96 x10E6/uL (ref 3.77–5.28)
RDW: 13.6 % (ref 11.7–15.4)
WBC: 6.9 x10E3/uL (ref 3.4–10.8)

## 2024-03-27 LAB — LIPID PANEL
Cholesterol, Total: 219 mg/dL — AB (ref 100–199)
HDL: 82 mg/dL (ref >39)
LDL CALC COMMENT:: 2.7 ratio (ref 0.0–4.4)
LDL Chol Calc (NIH): 125 mg/dL — AB (ref 0–99)
Triglycerides: 66 mg/dL (ref 0–149)
VLDL Cholesterol Cal: 12 mg/dL (ref 5–40)

## 2024-03-27 LAB — VITAMIN D 25 HYDROXY (VIT D DEFICIENCY, FRACTURES): Vit D, 25-Hydroxy: 46.1 ng/mL (ref 30.0–100.0)

## 2024-04-03 ENCOUNTER — Ambulatory Visit (HOSPITAL_COMMUNITY)
Admission: RE | Admit: 2024-04-03 | Discharge: 2024-04-03 | Disposition: A | Discharge status: Home / Self Care | Source: Ambulatory Visit | Attending: Cardiology | Admitting: Cardiology

## 2024-04-03 DIAGNOSIS — I350 Nonrheumatic aortic (valve) stenosis: Secondary | ICD-10-CM | POA: Diagnosis not present

## 2024-04-03 LAB — ECHOCARDIOGRAM COMPLETE
AR max vel: 0.9 cm2
AV Area VTI: 0.9 cm2
AV Area mean vel: 0.76 cm2
AV Mean grad: 27 mmHg
AV Peak grad: 41.7 mmHg
Ao pk vel: 3.23 m/s
Area-P 1/2: 3.07 cm2
P 1/2 time: 189 ms
S' Lateral: 2.5 cm

## 2024-04-04 ENCOUNTER — Ambulatory Visit: Payer: Self-pay | Admitting: Cardiology

## 2024-04-10 NOTE — Progress Notes (Unsigned)
 This encounter was created in error - please disregard.

## 2024-04-29 DIAGNOSIS — B351 Tinea unguium: Secondary | ICD-10-CM | POA: Diagnosis not present

## 2024-04-29 DIAGNOSIS — M79675 Pain in left toe(s): Secondary | ICD-10-CM | POA: Diagnosis not present

## 2024-04-29 DIAGNOSIS — M79674 Pain in right toe(s): Secondary | ICD-10-CM | POA: Diagnosis not present

## 2024-05-12 ENCOUNTER — Other Ambulatory Visit: Payer: Self-pay | Admitting: Family

## 2024-05-12 DIAGNOSIS — R413 Other amnesia: Secondary | ICD-10-CM

## 2024-06-02 ENCOUNTER — Other Ambulatory Visit: Payer: Self-pay | Admitting: Family Medicine

## 2024-06-02 ENCOUNTER — Ambulatory Visit
Admission: RE | Admit: 2024-06-02 | Discharge: 2024-06-02 | Disposition: A | Discharge status: Home / Self Care | Source: Ambulatory Visit | Attending: Family Medicine | Admitting: Family Medicine

## 2024-06-02 DIAGNOSIS — Z1231 Encounter for screening mammogram for malignant neoplasm of breast: Secondary | ICD-10-CM

## 2024-06-24 ENCOUNTER — Ambulatory Visit: Payer: Self-pay

## 2024-06-24 ENCOUNTER — Ambulatory Visit (INDEPENDENT_AMBULATORY_CARE_PROVIDER_SITE_OTHER)

## 2024-06-24 VITALS — BP 140/85 | HR 68 | Ht <= 58 in | Wt 122.0 lb

## 2024-06-24 DIAGNOSIS — Z0000 Encounter for general adult medical examination without abnormal findings: Secondary | ICD-10-CM | POA: Diagnosis not present

## 2024-06-24 NOTE — Progress Notes (Unsigned)
 Subjective:   [name] is a 83 y.o. female who presents for a Medicare Annual Wellness Visit.  I connected with  Chrishonda R Spreen on 06/25/24 by a audio enabled telemedicine application and verified that I am speaking with the correct person using two identifiers.  Patient Location: Home  Provider Location: Home Office  I discussed the limitations of evaluation and management by telemedicine. The patient expressed understanding and agreed to proceed.   Allergies (verified) Patient has no known allergies.   History: Past Medical History:  Diagnosis Date   Allergy    seasonal   Cataract    Hypertension 12/31/2023   Localized osteoporosis without current pathological fracture 12/29/2016   Murmur 05/18/2020   Past Surgical History:  Procedure Laterality Date   BREAST CYST EXCISION Right    CATARACT EXTRACTION, BILATERAL  2009   CESAREAN SECTION     URETHRAL SLING  2009   UTERINE FIBROID SURGERY     Family History  Problem Relation Age of Onset   Heart disease Mother        No details.  Died age 39.   COPD Father    Breast cancer Neg Hx    Social History   Occupational History   Not on file  Tobacco Use   Smoking status: Never   Smokeless tobacco: Never  Vaping Use   Vaping status: Never Used  Substance and Sexual Activity   Alcohol use: No   Drug use: No   Sexual activity: Never   Tobacco Counseling Counseling given: Yes  SDOH Screenings   Food Insecurity: No Food Insecurity (06/24/2024)  Housing: Unknown (06/24/2024)  Transportation Needs: No Transportation Needs (06/24/2024)  Utilities: Not At Risk (06/24/2024)  Alcohol Screen: Low Risk  (04/10/2023)  Depression (PHQ2-9): Low Risk  (06/24/2024)  Financial Resource Strain: Low Risk  (04/10/2023)  Physical Activity: Insufficiently Active (06/24/2024)  Social Connections: Moderately Integrated (06/24/2024)  Stress: No Stress Concern Present (06/24/2024)  Tobacco Use: Low Risk  (06/24/2024)  Health Literacy:  Adequate Health Literacy (06/24/2024)   Depression Screen    06/24/2024    9:01 AM 03/26/2024    9:16 AM 11/27/2023   10:40 AM 04/10/2023    1:08 PM 03/26/2023    9:07 AM 03/26/2023    8:58 AM 03/22/2022    9:00 AM  PHQ 2/9 Scores  PHQ - 2 Score 0 0 4 0 0 0 0  PHQ- 9 Score   9         Goals Addressed             This Visit's Progress    DIET - INCREASE WATER INTAKE   On track      Visit info / Clinical Intake: Medicare Wellness Visit Type:: Subsequent Annual Wellness Visit Medicare Wellness Visit Mode:: Telephone If telephone:: video declined Interpreter Needed?: No Pre-visit prep was completed: yes AWV questionnaire completed by patient prior to visit?: no Living arrangements:: (!) lives alone Patient's Overall Health Status Rating: very good Typical amount of pain: none Does pain affect daily life?: no Are you currently prescribed opioids?: no  Dietary Habits and Nutritional Risks Eats fruit and vegetables daily?: yes Most meals are obtained by: preparing own meals Diabetic:: no  Functional Status Activities of Daily Living (to include ambulation/medication): Independent Ambulation: Independent with device- listed below Home Assistive Devices/Equipment: Walker (specify Type) Medication Administration: Independent Home Management: Independent Manage your own finances?: yes Primary transportation is: driving Concerns about hearing?: (!) yes Uses hearing aids?: ROLLEN)  yes  Fall Screening Falls in the past year?: 1 Number of falls in past year: 0 Was there an injury with Fall?: 0 Fall Risk Category Calculator: 1 Patient Fall Risk Level: Low Fall Risk  Fall Risk Patient at Risk for Falls Due to: Impaired balance/gait; Impaired mobility Fall risk Follow up: Falls evaluation completed; Education provided  Home and Transportation Safety: All rugs have non-skid backing?: yes All stairs or steps have railings?: yes Grab bars in the bathtub or shower?: yes Have non-skid  surface in bathtub or shower?: yes Good home lighting?: yes Regular seat belt use?: yes Hospital stays in the last year:: no  Cognitive Assessment Difficulty concentrating, remembering, or making decisions? : yes (remembering) Will 6CIT or Mini Cog be Completed: yes What year is it?: 0 points What month is it?: 0 points Give patient an address phrase to remember (5 components): [address] [place], [place] About what time is it?: 0 points Count backwards from 20 to 1: 0 points Say the months of the year in reverse: 0 points Repeat the address phrase from earlier: 0 points 6 CIT Score: 0 points  Advance Directives (For Healthcare) Does Patient Have a Medical Advance Directive?: Yes Type of Advance Directive: Healthcare Power of Attorney  Reviewed/Updated  Reviewed/Updated: All; Medical History; Surgical History; Family History; Medications; Allergies; Care Teams; Patient Goals        Objective:    There were no vitals filed for this visit. There is no height or weight on file to calculate BMI.  Current Medications (verified) Outpatient Encounter Medications as of 06/24/2024  Medication Sig   calcium carbonate (OS-CAL - DOSED IN MG OF ELEMENTAL CALCIUM) 1250 (500 Ca) MG tablet Take 1 tablet by mouth.   donepezil  (ARICEPT ) 5 MG tablet TAKE 1 TABLET BY MOUTH EVERYDAY AT BEDTIME   memantine  (NAMENDA ) 5 MG tablet Take 1 tablet (5 mg total) by mouth 2 (two) times daily.   raloxifene  (EVISTA ) 60 MG tablet Take 1 tablet (60 mg total) by mouth daily.   No facility-administered encounter medications on file as of 06/24/2024.   Hearing/Vision screen Hearing Screening - Comments:: Pt have hearing dif/use hearing aids Vision Screening - Comments:: Pt corrective surgery/sometimes uses OTC glasses/pt last [employer] in [place]/last ov last 40yrs Immunizations and Health Maintenance Health Maintenance  Topic Date Due   Zoster Vaccines- Shingrix (1 of 2) 10/12/1957   COVID-19 Vaccine (3 - Moderna  risk series) 12/29/2019   Influenza Vaccine  11/18/2024 (Originally 03/21/2024)   DTaP/Tdap/Td (1 - Tdap) 03/26/2025 (Originally 10/12/1959)   Mammogram  06/02/2025   Medicare Annual Wellness (AWV)  06/24/2025   Pneumococcal Vaccine: 50+ Years  Completed   DEXA SCAN  Completed   Meningococcal B Vaccine  Aged Out        Assessment/Plan:  This is a routine wellness examination for [place].  Patient Care Team: Zollie Lowers, MD as PCP - General (Family Medicine) Lavona Agent, MD as PCP - Cardiology (Cardiology)  I have personally reviewed and noted the following in the patient's chart:   Medical and social history Use of alcohol, tobacco or illicit drugs  Current medications and supplements including opioid prescriptions. Functional ability and status Nutritional status Physical activity Advanced directives List of other physicians Hospitalizations, surgeries, and ER visits in previous 12 months Vitals Screenings to include cognitive, depression, and falls Referrals and appointments  No orders of the defined types were placed in this encounter.  In addition, I have reviewed and discussed with patient certain  preventive protocols, quality metrics, and best practice recommendations. A written personalized care plan for preventive services as well as general preventive health recommendations were provided to patient.   Ozie Ned, CMA   06/24/2024   Return in 1 year (on 06/24/2025).  After Visit Summary: (MyChart) Due to this being a telephonic visit, the after visit summary with patients personalized plan was offered to patient via MyChart   Nurse Notes: pt is aware and due w/Covid & Shingles vaccine

## 2024-09-29 ENCOUNTER — Ambulatory Visit: Payer: Self-pay | Admitting: Family Medicine

## 2024-10-06 ENCOUNTER — Ambulatory Visit: Admitting: Family Medicine

## 2025-06-25 ENCOUNTER — Ambulatory Visit
